# Patient Record
Sex: Male | Born: 1965 | Race: Black or African American | Hispanic: No | Marital: Single | Smoking: Current every day smoker
Health system: Southern US, Community
[De-identification: ages and names within clinical notes are randomized; demographics above are authoritative.]

## PROBLEM LIST (undated history)

## (undated) DIAGNOSIS — F32A Depression, unspecified: Secondary | ICD-10-CM

## (undated) DIAGNOSIS — K219 Gastro-esophageal reflux disease without esophagitis: Secondary | ICD-10-CM

## (undated) DIAGNOSIS — F1910 Other psychoactive substance abuse, uncomplicated: Secondary | ICD-10-CM

## (undated) DIAGNOSIS — F329 Major depressive disorder, single episode, unspecified: Secondary | ICD-10-CM

## (undated) DIAGNOSIS — F419 Anxiety disorder, unspecified: Secondary | ICD-10-CM

---

## 1998-02-20 ENCOUNTER — Emergency Department (HOSPITAL_COMMUNITY): Admission: EM | Admit: 1998-02-20 | Discharge: 1998-02-20 | Payer: Self-pay | Admitting: Emergency Medicine

## 1998-04-17 ENCOUNTER — Emergency Department (HOSPITAL_COMMUNITY): Admission: EM | Admit: 1998-04-17 | Discharge: 1998-04-17 | Payer: Self-pay | Admitting: Emergency Medicine

## 1998-04-17 ENCOUNTER — Encounter: Payer: Self-pay | Admitting: Emergency Medicine

## 1998-11-15 ENCOUNTER — Emergency Department (HOSPITAL_COMMUNITY): Admission: EM | Admit: 1998-11-15 | Discharge: 1998-11-15 | Payer: Self-pay | Admitting: Emergency Medicine

## 1999-09-29 ENCOUNTER — Emergency Department (HOSPITAL_COMMUNITY): Admission: EM | Admit: 1999-09-29 | Discharge: 1999-09-29 | Payer: Self-pay | Admitting: Emergency Medicine

## 1999-10-29 ENCOUNTER — Encounter: Payer: Self-pay | Admitting: Emergency Medicine

## 1999-10-29 ENCOUNTER — Emergency Department (HOSPITAL_COMMUNITY): Admission: EM | Admit: 1999-10-29 | Discharge: 1999-10-29 | Payer: Self-pay | Admitting: Emergency Medicine

## 2000-03-21 ENCOUNTER — Encounter: Payer: Self-pay | Admitting: Emergency Medicine

## 2000-03-21 ENCOUNTER — Emergency Department (HOSPITAL_COMMUNITY): Admission: EM | Admit: 2000-03-21 | Discharge: 2000-03-21 | Payer: Self-pay | Admitting: Emergency Medicine

## 2000-03-22 ENCOUNTER — Emergency Department (HOSPITAL_COMMUNITY): Admission: EM | Admit: 2000-03-22 | Discharge: 2000-03-22 | Payer: Self-pay | Admitting: Emergency Medicine

## 2000-12-19 ENCOUNTER — Emergency Department (HOSPITAL_COMMUNITY): Admission: EM | Admit: 2000-12-19 | Discharge: 2000-12-20 | Payer: Self-pay | Admitting: Emergency Medicine

## 2004-05-31 ENCOUNTER — Emergency Department (HOSPITAL_COMMUNITY): Admission: EM | Admit: 2004-05-31 | Discharge: 2004-05-31 | Payer: Self-pay | Admitting: Emergency Medicine

## 2007-10-11 ENCOUNTER — Emergency Department (HOSPITAL_COMMUNITY): Admission: EM | Admit: 2007-10-11 | Discharge: 2007-10-11 | Payer: Self-pay | Admitting: Emergency Medicine

## 2009-04-13 ENCOUNTER — Inpatient Hospital Stay (HOSPITAL_COMMUNITY): Admission: EM | Admit: 2009-04-13 | Discharge: 2009-04-21 | Payer: Self-pay | Admitting: Emergency Medicine

## 2009-04-20 ENCOUNTER — Ambulatory Visit: Payer: Self-pay | Admitting: Physical Medicine & Rehabilitation

## 2010-02-16 ENCOUNTER — Emergency Department (HOSPITAL_BASED_OUTPATIENT_CLINIC_OR_DEPARTMENT_OTHER): Admission: EM | Admit: 2010-02-16 | Discharge: 2010-02-16 | Payer: Self-pay | Admitting: Emergency Medicine

## 2010-07-20 LAB — COMPREHENSIVE METABOLIC PANEL
ALT: 7 U/L (ref 0–53)
AST: 18 U/L (ref 0–37)
CO2: 26 mEq/L (ref 19–32)
Calcium: 10.2 mg/dL (ref 8.4–10.5)
Creatinine, Ser: 1 mg/dL (ref 0.4–1.5)
GFR calc non Af Amer: >60 mL/min (ref >60)
Sodium: 142 mEq/L (ref 135–145)
Total Protein: 8.3 g/dL (ref 6.0–8.3)

## 2010-07-20 LAB — CBC
MCH: 32.3 pg (ref 26.0–34.0)
MCHC: 34 g/dL (ref 30.0–36.0)
MCV: 95.1 fL (ref 78.0–100.0)
Platelets: 288 10*3/uL (ref 150–400)
RBC: 5.55 MIL/uL (ref 4.22–5.81)

## 2010-07-20 LAB — DIFFERENTIAL
Basophils Absolute: 0.2 10*3/uL — ABNORMAL HIGH (ref 0.0–0.1)
Lymphocytes Relative: 23 % (ref 12–46)
Lymphs Abs: 2.2 10*3/uL (ref 0.7–4.0)
Monocytes Absolute: 0.4 10*3/uL (ref 0.1–1.0)
Neutro Abs: 7 10*3/uL (ref 1.7–7.7)

## 2010-07-20 LAB — LIPASE, BLOOD: Lipase: 26 U/L (ref 23–300)

## 2010-08-08 LAB — BASIC METABOLIC PANEL
BUN: 10 mg/dL (ref 6–23)
BUN: 5 mg/dL — ABNORMAL LOW (ref 6–23)
BUN: 7 mg/dL (ref 6–23)
BUN: 8 mg/dL (ref 6–23)
BUN: 9 mg/dL (ref 6–23)
Calcium: 8.1 mg/dL — ABNORMAL LOW (ref 8.4–10.5)
Calcium: 8.5 mg/dL (ref 8.4–10.5)
Calcium: 9.3 mg/dL (ref 8.4–10.5)
Chloride: 110 mEq/L (ref 96–112)
Creatinine, Ser: 0.75 mg/dL (ref 0.4–1.5)
Creatinine, Ser: 1.04 mg/dL (ref 0.4–1.5)
Creatinine, Ser: 1.36 mg/dL (ref 0.4–1.5)
GFR calc Af Amer: >60 mL/min (ref >60)
GFR calc Af Amer: >60 mL/min (ref >60)
GFR calc non Af Amer: 57 mL/min — ABNORMAL LOW (ref >60)
GFR calc non Af Amer: >60 mL/min (ref >60)
GFR calc non Af Amer: >60 mL/min (ref >60)
GFR calc non Af Amer: >60 mL/min (ref >60)
Glucose, Bld: 134 mg/dL — ABNORMAL HIGH (ref 70–99)
Glucose, Bld: 93 mg/dL (ref 70–99)
Potassium: 3.6 mEq/L (ref 3.5–5.1)
Potassium: 3.8 mEq/L (ref 3.5–5.1)

## 2010-08-08 LAB — TYPE AND SCREEN: Antibody Screen: NEGATIVE

## 2010-08-08 LAB — COMPREHENSIVE METABOLIC PANEL
ALT: 8 U/L (ref 0–53)
AST: 17 U/L (ref 0–37)
Albumin: 2.8 g/dL — ABNORMAL LOW (ref 3.5–5.2)
Calcium: 6.7 mg/dL — ABNORMAL LOW (ref 8.4–10.5)
Glucose, Bld: 131 mg/dL — ABNORMAL HIGH (ref 70–99)
Sodium: 141 mEq/L (ref 135–145)
Total Protein: 5.3 g/dL — ABNORMAL LOW (ref 6.0–8.3)

## 2010-08-08 LAB — CBC
HCT: 32.7 % — ABNORMAL LOW (ref 39.0–52.0)
HCT: 37.2 % — ABNORMAL LOW (ref 39.0–52.0)
MCHC: 33.8 g/dL (ref 30.0–36.0)
MCHC: 34.6 g/dL (ref 30.0–36.0)
MCV: 92.4 fL (ref 78.0–100.0)
MCV: 92.7 fL (ref 78.0–100.0)
MCV: 94 fL (ref 78.0–100.0)
Platelets: 203 10*3/uL (ref 150–400)
Platelets: 204 10*3/uL (ref 150–400)
Platelets: 229 10*3/uL (ref 150–400)
Platelets: 330 10*3/uL (ref 150–400)
Platelets: 361 10*3/uL (ref 150–400)
RBC: 3.51 MIL/uL — ABNORMAL LOW (ref 4.22–5.81)
RDW: 13.6 % (ref 11.5–15.5)
RDW: 13.8 % (ref 11.5–15.5)
WBC: 10.8 10*3/uL — ABNORMAL HIGH (ref 4.0–10.5)
WBC: 11 10*3/uL — ABNORMAL HIGH (ref 4.0–10.5)
WBC: 7.4 10*3/uL (ref 4.0–10.5)

## 2010-08-08 LAB — PROTIME-INR
INR: 1.14 (ref 0.00–1.49)
Prothrombin Time: 14.5 seconds (ref 11.6–15.2)

## 2010-08-08 LAB — URINALYSIS, ROUTINE W REFLEX MICROSCOPIC
Bilirubin Urine: NEGATIVE
Bilirubin Urine: NEGATIVE
Glucose, UA: NEGATIVE mg/dL
Glucose, UA: NEGATIVE mg/dL
Ketones, ur: 15 mg/dL — AB
Nitrite: NEGATIVE
Protein, ur: NEGATIVE mg/dL
Specific Gravity, Urine: 1.014 (ref 1.005–1.030)
pH: 5.5 (ref 5.0–8.0)
pH: 7.5 (ref 5.0–8.0)

## 2010-08-08 LAB — POCT I-STAT, CHEM 8
BUN: 6 mg/dL (ref 6–23)
Creatinine, Ser: 0.6 mg/dL (ref 0.4–1.5)
Hemoglobin: 14.3 g/dL (ref 13.0–17.0)
TCO2: 16 mmol/L (ref 0–100)

## 2010-08-08 LAB — TROPONIN I: Troponin I: 0.02 ng/mL (ref 0.00–0.06)

## 2010-08-08 LAB — GLUCOSE, CAPILLARY: Glucose-Capillary: 126 mg/dL — ABNORMAL HIGH (ref 70–99)

## 2010-08-08 LAB — POCT CARDIAC MARKERS
Myoglobin, poc: 149 ng/mL (ref 12–200)
Troponin i, poc: <0.05 ng/mL (ref 0.00–0.09)

## 2010-08-08 LAB — CK TOTAL AND CKMB (NOT AT ARMC)
Relative Index: 0.2 (ref 0.0–2.5)
Total CK: 1058 U/L — ABNORMAL HIGH (ref 7–232)

## 2010-08-08 LAB — CULTURE, BLOOD (SINGLE): Culture: NO GROWTH

## 2010-09-05 ENCOUNTER — Emergency Department (HOSPITAL_COMMUNITY): Payer: Self-pay

## 2010-09-05 ENCOUNTER — Emergency Department (HOSPITAL_COMMUNITY)
Admission: EM | Admit: 2010-09-05 | Discharge: 2010-09-05 | Discharge status: Against Medical Advice | Payer: Self-pay | Attending: Emergency Medicine | Admitting: Emergency Medicine

## 2010-09-05 DIAGNOSIS — W268XXA Contact with other sharp object(s), not elsewhere classified, initial encounter: Secondary | ICD-10-CM | POA: Insufficient documentation

## 2010-09-05 DIAGNOSIS — S61409A Unspecified open wound of unspecified hand, initial encounter: Secondary | ICD-10-CM | POA: Insufficient documentation

## 2012-03-29 ENCOUNTER — Emergency Department (HOSPITAL_COMMUNITY)
Admission: EM | Admit: 2012-03-29 | Discharge: 2012-03-29 | Disposition: A | Discharge status: Home / Self Care | Payer: Self-pay | Attending: Emergency Medicine | Admitting: Emergency Medicine

## 2012-03-29 ENCOUNTER — Emergency Department (HOSPITAL_COMMUNITY): Payer: Self-pay

## 2012-03-29 DIAGNOSIS — R1110 Vomiting, unspecified: Secondary | ICD-10-CM | POA: Insufficient documentation

## 2012-03-29 DIAGNOSIS — R109 Unspecified abdominal pain: Secondary | ICD-10-CM | POA: Insufficient documentation

## 2012-03-29 LAB — CBC WITH DIFFERENTIAL/PLATELET
Eosinophils Absolute: 0.1 10*3/uL (ref 0.0–0.7)
HCT: 47 % (ref 39.0–52.0)
Hemoglobin: 16.4 g/dL (ref 13.0–17.0)
Lymphs Abs: 1.9 10*3/uL (ref 0.7–4.0)
MCH: 31.9 pg (ref 26.0–34.0)
Monocytes Absolute: 0.6 10*3/uL (ref 0.1–1.0)
Monocytes Relative: 5 % (ref 3–12)
Neutrophils Relative %: 76 % (ref 43–77)
RBC: 5.14 MIL/uL (ref 4.22–5.81)

## 2012-03-29 LAB — COMPREHENSIVE METABOLIC PANEL
Alkaline Phosphatase: 80 U/L (ref 39–117)
BUN: 17 mg/dL (ref 6–23)
Chloride: 93 mEq/L — ABNORMAL LOW (ref 96–112)
GFR calc Af Amer: >90 mL/min (ref >90)
Glucose, Bld: 79 mg/dL (ref 70–99)
Potassium: 3.5 mEq/L (ref 3.5–5.1)
Total Bilirubin: 0.8 mg/dL (ref 0.3–1.2)
Total Protein: 8 g/dL (ref 6.0–8.3)

## 2012-03-29 LAB — LIPASE, BLOOD: Lipase: 14 U/L (ref 11–59)

## 2012-03-29 MED ORDER — IOHEXOL 300 MG/ML  SOLN
100.0000 mL | Freq: Once | INTRAMUSCULAR | Status: AC | PRN
  Administered 2012-03-29: 100 mL via INTRAVENOUS

## 2012-03-29 MED ORDER — PROMETHAZINE HCL 25 MG PO TABS: 25.0000 mg | ORAL_TABLET | Freq: Four times a day (QID) | ORAL | Status: DC | PRN

## 2012-03-29 MED ORDER — OXYCODONE-ACETAMINOPHEN 5-325 MG PO TABS: 1.0000 | ORAL_TABLET | Freq: Four times a day (QID) | ORAL | Status: DC | PRN

## 2012-03-29 MED ORDER — ONDANSETRON HCL 4 MG/2ML IJ SOLN
4.0000 mg | Freq: Once | INTRAMUSCULAR | Status: AC
  Administered 2012-03-29: 4 mg via INTRAVENOUS
  Filled 2012-03-29: qty 2

## 2012-03-29 MED ORDER — HYDROMORPHONE HCL PF 1 MG/ML IJ SOLN
1.0000 mg | Freq: Once | INTRAMUSCULAR | Status: AC
  Administered 2012-03-29: 1 mg via INTRAVENOUS
  Filled 2012-03-29: qty 1

## 2012-03-29 MED ORDER — SODIUM CHLORIDE 0.9 % IV SOLN
1000.0000 mL | Freq: Once | INTRAVENOUS | Status: AC
  Administered 2012-03-29: 1000 mL via INTRAVENOUS

## 2012-03-29 NOTE — ED Notes (Signed)
Pt vomited x2 since arrival.  Nose currently bleeding.

## 2012-03-29 NOTE — ED Notes (Signed)
Per EMS, pt c/o abdominal pain with n/v for the past 2-4 days.  Pt vitals and CBG WNL.  Pt A&O.  EMS states mass palpated in RLQ.

## 2012-03-29 NOTE — ED Notes (Signed)
Patient transported to CT 

## 2012-03-29 NOTE — ED Provider Notes (Signed)
History     CSN: [id number]  Arrival date & time 03/29/12  2010   First MD Initiated Contact with Patient 03/29/12 2015      Chief Complaint  Patient presents with  . Abdominal Pain  . Emesis    (Consider location/radiation/quality/duration/timing/severity/associated sxs/prior treatment) HPI... right upper quadrant pain for 3-4 days.  No nausea, vomiting, diarrhea, fever.  No previous abdominal surgery. No history of gallbladder disease. He smokes cigarettes and drinks alcohol excessively.  Status post multiple metallic plates in his head secondary to fall several years ago  No past medical history on file.  No past surgical history on file.  No family history on file.  History  Substance Use Topics  . Smoking status: Not on file  . Smokeless tobacco: Not on file  . Alcohol Use: Not on file      Review of Systems  All other systems reviewed and are negative.    Allergies  Review of patient's allergies indicates no known allergies.  Home Medications   Current Outpatient Rx  Name  Route  Sig  Dispense  Refill  . RANITIDINE HCL 150 MG PO TABS   Oral   Take 150 mg by mouth 2 (two) times daily.         . OXYCODONE-ACETAMINOPHEN 5-325 MG PO TABS   Oral   Take 1-2 tablets by mouth every 6 (six) hours as needed for pain.   15 tablet   0   . PROMETHAZINE HCL 25 MG PO TABS   Oral   Take 1 tablet (25 mg total) by mouth every 6 (six) hours as needed for nausea.   10 tablet   0     BP 155/81  Pulse 75  Temp 98.2 F (36.8 C) (Oral)  Resp 18  SpO2 100%  Physical Exam  Nursing note and vitals reviewed. Constitutional: He is oriented to person, place, and time. He appears well-developed and well-nourished.  HENT:  Head: Normocephalic and atraumatic.  Eyes: Conjunctivae normal and EOM are normal. Pupils are equal, round, and reactive to light.  Neck: Normal range of motion. Neck supple.  Cardiovascular: Normal rate, regular rhythm and normal heart sounds.    Pulmonary/Chest: Effort normal and breath sounds normal.  Abdominal: Soft. Bowel sounds are normal.       Minimal right upper quadrant tenderness  Musculoskeletal: Normal range of motion.  Neurological: He is alert and oriented to person, place, and time.  Skin: Skin is warm and dry.  Psychiatric: He has a normal mood and affect.    ED Course  Procedures (including critical care time)  Labs Reviewed  CBC WITH DIFFERENTIAL - Abnormal; Notable for the following:    Neutro Abs 7.9 (*)     All other components within normal limits  COMPREHENSIVE METABOLIC PANEL - Abnormal; Notable for the following:    Chloride 93 (*)     All other components within normal limits  LIPASE, BLOOD  URINALYSIS, MICROSCOPIC ONLY   Ct Abdomen Pelvis W Contrast  03/29/2012  *RADIOLOGY REPORT*  Clinical Data: Right upper abdominal pain.  CT ABDOMEN AND PELVIS WITH CONTRAST  Technique:  Multidetector CT imaging of the abdomen and pelvis was performed following the standard protocol during bolus administration of intravenous contrast.  Contrast: OMNIPAQUE IOHEXOL 300 MG/ML  SOLN  Comparison: 04/13/2009  Findings: Visualized lung bases clear.  Unremarkable liver, nondilated gallbladder, spleen, adrenal glands, pancreas, kidneys. Mild aortoiliac arterial calcifications.  Stomach is incompletely distended.  Proximal bowel is  mildly distended, and there is poor distal progression of the oral contrast material.  No discrete transition point.  The appendix is not discretely identified.  The colon is nondilated.  No ascites.  No free air.  Urinary bladder physiologically distended.  No hydronephrosis.  Lumbar spine intact.  IMPRESSION:  1. No acute abdominal process.   Original Report Authenticated By: D. Andria Rhein, MD      1. Abdominal pain       MDM  No acute abdomen. CT scan negative. Vital signs normal. Discharge him on Percocet #15 and Phenergan 25 mg #10        Donnetta Hutching, MD 03/29/12 2329

## 2012-03-29 NOTE — ED Notes (Signed)
Patient is alert and oriented x3.  He was given DC instructions and follow up visit instructions.  Patient gave verbal understanding.  He was DC ambulatory under his own power to home.  V/S stable.  He was not showing any signs of distress on DC 

## 2012-03-29 NOTE — ED Notes (Signed)
Bed:WA08<BR> Expected date:<BR> Expected time:<BR> Means of arrival:<BR> Comments:<BR>

## 2012-03-31 ENCOUNTER — Encounter (HOSPITAL_COMMUNITY): Payer: Self-pay | Admitting: *Deleted

## 2012-03-31 ENCOUNTER — Emergency Department (HOSPITAL_COMMUNITY)
Admission: EM | Admit: 2012-03-31 | Discharge: 2012-03-31 | Disposition: A | Discharge status: Home / Self Care | Payer: Self-pay | Attending: Emergency Medicine | Admitting: Emergency Medicine

## 2012-03-31 DIAGNOSIS — Z79899 Other long term (current) drug therapy: Secondary | ICD-10-CM | POA: Insufficient documentation

## 2012-03-31 DIAGNOSIS — R1110 Vomiting, unspecified: Secondary | ICD-10-CM

## 2012-03-31 DIAGNOSIS — R197 Diarrhea, unspecified: Secondary | ICD-10-CM | POA: Insufficient documentation

## 2012-03-31 DIAGNOSIS — R112 Nausea with vomiting, unspecified: Secondary | ICD-10-CM | POA: Insufficient documentation

## 2012-03-31 LAB — URINALYSIS, MICROSCOPIC ONLY
Glucose, UA: NEGATIVE mg/dL
Hgb urine dipstick: NEGATIVE
Specific Gravity, Urine: 1.03 (ref 1.005–1.030)
Urobilinogen, UA: 1 mg/dL (ref 0.0–1.0)

## 2012-03-31 LAB — COMPREHENSIVE METABOLIC PANEL
AST: 20 U/L (ref 0–37)
Albumin: 4.4 g/dL (ref 3.5–5.2)
Calcium: 10.5 mg/dL (ref 8.4–10.5)
Creatinine, Ser: 0.93 mg/dL (ref 0.50–1.35)

## 2012-03-31 MED ORDER — SODIUM CHLORIDE 0.9 % IV SOLN: Freq: Once | INTRAVENOUS | Status: DC

## 2012-03-31 MED ORDER — SODIUM CHLORIDE 0.9 % IV BOLUS (SEPSIS)
2000.0000 mL | Freq: Once | INTRAVENOUS | Status: AC
  Administered 2012-03-31: 2000 mL via INTRAVENOUS

## 2012-03-31 MED ORDER — ONDANSETRON HCL 4 MG/2ML IJ SOLN
4.0000 mg | Freq: Once | INTRAMUSCULAR | Status: AC
  Administered 2012-03-31: 4 mg via INTRAVENOUS
  Filled 2012-03-31: qty 2

## 2012-03-31 MED ORDER — KETOROLAC TROMETHAMINE 30 MG/ML IJ SOLN
30.0000 mg | Freq: Once | INTRAMUSCULAR | Status: AC
  Administered 2012-03-31: 30 mg via INTRAVENOUS
  Filled 2012-03-31: qty 1

## 2012-03-31 MED ORDER — KETOROLAC TROMETHAMINE 30 MG/ML IJ SOLN: 30.0000 mg | Freq: Once | INTRAMUSCULAR | Status: AC

## 2012-03-31 NOTE — ED Notes (Signed)
Rx for pt is ready to get picked up by pt.

## 2012-03-31 NOTE — ED Notes (Signed)
Pt in c/o abd pain and decreased appetite over last 7 days, also c/o n/v

## 2012-03-31 NOTE — ED Provider Notes (Signed)
History     CSN: [id number]  Arrival date & time 03/31/12  1831   First MD Initiated Contact with Patient 03/31/12 2000      Chief Complaint  Patient presents with  . Abdominal Pain    (Consider location/radiation/quality/duration/timing/severity/associated sxs/prior treatment) The history is provided by the patient.  Parker Berg is a 46 y.o. male here with abdominal pain and vomiting. Diffuse abdominal pain for a week, + vomiting and unable to tolerate PO. He was seen here two days ago and had nl CT ab/pel and nl labs. He was given a prescription of phenergan and percocet but didn't have enough money to fill them. He denies fever and urinary symptoms and diarrhea. No abdominal surgeries in the past.    History reviewed. No pertinent past medical history.  History reviewed. No pertinent past surgical history.  History reviewed. No pertinent family history.  History  Substance Use Topics  . Smoking status: Not on file  . Smokeless tobacco: Not on file  . Alcohol Use: Not on file      Review of Systems  Gastrointestinal: Positive for nausea, vomiting and abdominal pain.  All other systems reviewed and are negative.    Allergies  Review of patient's allergies indicates no known allergies.  Home Medications   Current Outpatient Rx  Name  Route  Sig  Dispense  Refill  . OXYCODONE-ACETAMINOPHEN 5-325 MG PO TABS   Oral   Take 1-2 tablets by mouth every 6 (six) hours as needed for pain.   15 tablet   0   . PROMETHAZINE HCL 25 MG PO TABS   Oral   Take 25 mg by mouth every 6 (six) hours as needed. Nausea         . RANITIDINE HCL 150 MG PO TABS   Oral   Take 150 mg by mouth 2 (two) times daily.           BP 122/80  Pulse 63  Temp 98.6 F (37 C) (Oral)  Resp 20  SpO2 100%  Physical Exam  Nursing note and vitals reviewed. Constitutional: He is oriented to person, place, and time. He appears well-developed and well-nourished.       Tearful, crying with  tears   HENT:  Head: Normocephalic.       MM slightly dry   Eyes: Conjunctivae normal are normal. Pupils are equal, round, and reactive to light.  Neck: Normal range of motion. Neck supple.  Cardiovascular: Normal rate, regular rhythm and normal heart sounds.   Pulmonary/Chest: Effort normal and breath sounds normal. No respiratory distress. He has no wheezes. He has no rales.  Abdominal: Soft. Bowel sounds are normal. He exhibits no distension. There is no tenderness. There is no rebound.  Musculoskeletal: Normal range of motion.  Neurological: He is alert and oriented to person, place, and time.  Skin: Skin is warm and dry.  Psychiatric:       Crying, anxious     ED Course  Procedures (including critical care time)  Labs Reviewed  COMPREHENSIVE METABOLIC PANEL - Abnormal; Notable for the following:    Sodium 134 (*)     Chloride 93 (*)     Glucose, Bld 111 (*)     All other components within normal limits  URINALYSIS, MICROSCOPIC ONLY - Abnormal; Notable for the following:    Color, Urine AMBER (*)  BIOCHEMICALS MAY BE AFFECTED BY COLOR   APPearance TURBID (*)     Bilirubin Urine SMALL (*)  Ketones, ur 40 (*)     Protein, ur 30 (*)     Leukocytes, UA SMALL (*)     All other components within normal limits  LIPASE, BLOOD   No results found.   No diagnosis found.    MDM  Parker Berg is a 46 y.o. male here with abdominal pain, vomiting. Likely gastroenteritis. Will hydrate patient, repeat labs. Will also give him several days of prescription.   10:39 PM Feels better. Repeat labs nl. UA showed small leuks but he is asymptomatic. Pharmacy able to fill his phenergan. Return precautions given.        Richardean Canal, MD 03/31/12 2240

## 2012-09-19 ENCOUNTER — Observation Stay (HOSPITAL_COMMUNITY)
Admission: EM | Admit: 2012-09-19 | Discharge: 2012-09-22 | DRG: 419 | Disposition: A | Discharge status: Home / Self Care | Attending: General Surgery | Admitting: General Surgery

## 2012-09-19 ENCOUNTER — Emergency Department (HOSPITAL_COMMUNITY)

## 2012-09-19 ENCOUNTER — Encounter (HOSPITAL_COMMUNITY): Payer: Self-pay

## 2012-09-19 DIAGNOSIS — J449 Chronic obstructive pulmonary disease, unspecified: Secondary | ICD-10-CM | POA: Insufficient documentation

## 2012-09-19 DIAGNOSIS — R1011 Right upper quadrant pain: Secondary | ICD-10-CM | POA: Insufficient documentation

## 2012-09-19 DIAGNOSIS — K819 Cholecystitis, unspecified: Principal | ICD-10-CM | POA: Insufficient documentation

## 2012-09-19 DIAGNOSIS — E860 Dehydration: Secondary | ICD-10-CM | POA: Insufficient documentation

## 2012-09-19 DIAGNOSIS — R109 Unspecified abdominal pain: Secondary | ICD-10-CM

## 2012-09-19 DIAGNOSIS — R1110 Vomiting, unspecified: Secondary | ICD-10-CM

## 2012-09-19 DIAGNOSIS — J4489 Other specified chronic obstructive pulmonary disease: Secondary | ICD-10-CM | POA: Insufficient documentation

## 2012-09-19 DIAGNOSIS — R110 Nausea: Secondary | ICD-10-CM

## 2012-09-19 DIAGNOSIS — K838 Other specified diseases of biliary tract: Secondary | ICD-10-CM | POA: Insufficient documentation

## 2012-09-19 DIAGNOSIS — K811 Chronic cholecystitis: Secondary | ICD-10-CM

## 2012-09-19 HISTORY — DX: Gastro-esophageal reflux disease without esophagitis: K21.9

## 2012-09-19 LAB — POCT I-STAT, CHEM 8
Glucose, Bld: 126 mg/dL — ABNORMAL HIGH (ref 70–99)
HCT: 60 % — ABNORMAL HIGH (ref 39.0–52.0)
Hemoglobin: 20.4 g/dL — ABNORMAL HIGH (ref 13.0–17.0)
Potassium: 2.8 mEq/L — ABNORMAL LOW (ref 3.5–5.1)

## 2012-09-19 LAB — COMPREHENSIVE METABOLIC PANEL
ALT: 19 U/L (ref 0–53)
Albumin: 4.4 g/dL (ref 3.5–5.2)
Alkaline Phosphatase: 84 U/L (ref 39–117)
BUN: 44 mg/dL — ABNORMAL HIGH (ref 6–23)
CO2: 34 mEq/L — ABNORMAL HIGH (ref 19–32)
Chloride: 82 mEq/L — ABNORMAL LOW (ref 96–112)
Total Protein: 8.4 g/dL — ABNORMAL HIGH (ref 6.0–8.3)

## 2012-09-19 LAB — CBC
HCT: 53.3 % — ABNORMAL HIGH (ref 39.0–52.0)
Hemoglobin: 18.9 g/dL — ABNORMAL HIGH (ref 13.0–17.0)
MCH: 31.3 pg (ref 26.0–34.0)
MCHC: 35.5 g/dL (ref 30.0–36.0)
MCV: 88.4 fL (ref 78.0–100.0)
Platelets: 349 10*3/uL (ref 150–400)
RBC: 6.03 MIL/uL — ABNORMAL HIGH (ref 4.22–5.81)
RDW: 11.8 % (ref 11.5–15.5)
WBC: 15.9 10*3/uL — ABNORMAL HIGH (ref 4.0–10.5)

## 2012-09-19 LAB — GLUCOSE, CAPILLARY: Glucose-Capillary: 110 mg/dL — ABNORMAL HIGH (ref 70–99)

## 2012-09-19 MED ORDER — MORPHINE SULFATE 4 MG/ML IJ SOLN
4.0000 mg | Freq: Once | INTRAMUSCULAR | Status: AC
  Administered 2012-09-19: 4 mg via INTRAVENOUS
  Filled 2012-09-19: qty 1

## 2012-09-19 MED ORDER — ONDANSETRON HCL 4 MG/2ML IJ SOLN
4.0000 mg | Freq: Once | INTRAMUSCULAR | Status: AC
  Administered 2012-09-19: 4 mg via INTRAVENOUS
  Filled 2012-09-19: qty 2

## 2012-09-19 MED ORDER — POTASSIUM CHLORIDE 10 MEQ/100ML IV SOLN
10.0000 meq | Freq: Once | INTRAVENOUS | Status: AC
  Administered 2012-09-19: 10 meq via INTRAVENOUS
  Filled 2012-09-19: qty 100

## 2012-09-19 MED ORDER — SODIUM CHLORIDE 0.9 % IV BOLUS (SEPSIS)
1000.0000 mL | Freq: Once | INTRAVENOUS | Status: AC
  Administered 2012-09-19: 1000 mL via INTRAVENOUS

## 2012-09-19 NOTE — ED Notes (Addendum)
Pt states he hasn't had an appetite since Wednesday. He states vomited dark brown emesis on Wednesday a couple times and hasn't felt good since then. Stays dizzy and it gets worse when he changes position. Can't keep anything down. They started him on phenergan, zantac, and prilosec. Tastes metal in his mouth. Denies coughing up blood or night sweats but does report a weight loss from 149 lbs from on 09/11/12 to 131.5 lbs on 09/19/12.

## 2012-09-19 NOTE — ED Notes (Signed)
Pt returned from ultrasound. Officer at bedside

## 2012-09-19 NOTE — ED Notes (Signed)
Spoke with Dr Donette Larry about plan of care for pt. Dr states waiting for consult from surgery to come down and see patient. Patient notified of delay.

## 2012-09-19 NOTE — ED Notes (Signed)
Pt c/o burning pain at IV site. No redness and swelling noted at IV site or skin. Slowed down potassium infusion to 50 ml/hr. Pt has normal saline hanging with potassium to help decrease burning sensation at IV site

## 2012-09-19 NOTE — ED Notes (Signed)
Warm blanket given. Officer remains at bedside

## 2012-09-19 NOTE — ED Notes (Signed)
Officer at bedside.

## 2012-09-19 NOTE — ED Notes (Signed)
Reassessed pt IV site. No redness or swelling noted. Pt denies burning at site and states feels much better after infusion was turned down. Officer at bedside. NAD noted at this time.

## 2012-09-19 NOTE — H&P (Signed)
Parker Berg is an 47 y.o. male.   Chief Complaint: abdominal pain HPI: the patient is a 47 year old male who is currently incarcerated.  For the last week and a half he has been experiencing right upper quadrant pain with nausea and vomiting. He states he has not been able to keep any kind of food or drink down. He denies any fevers or chills. He was brought to the emergency department where an ultrasound showed gallbladder sludge and thickening of the gallbladder wall.  Past Medical History  Diagnosis Date  . GERD (gastroesophageal reflux disease)     History reviewed. No pertinent past surgical history.  History reviewed. No pertinent family history. Social History:  reports that he has been smoking Cigarettes.  He has been smoking about 0.00 packs per day. He does not have any smokeless tobacco history on file. He reports that  drinks alcohol. He reports that he does not use illicit drugs.  Allergies: No Known Allergies   (Not in a hospital admission)  Results for orders placed during the hospital encounter of 09/19/12 (from the past 48 hour(s))  CBC     Status: Abnormal   Collection Time    09/19/12  5:52 PM      Result Value Range   WBC 15.9 (*) 4.0 - 10.5 K/uL   RBC 6.03 (*) 4.22 - 5.81 MIL/uL   Hemoglobin 18.9 (*) 13.0 - 17.0 g/dL   HCT 16.1 (*) 09.6 - 04.5 %   MCV 88.4  78.0 - 100.0 fL   MCH 31.3  26.0 - 34.0 pg   MCHC 35.5  30.0 - 36.0 g/dL   RDW 40.9  81.1 - 91.4 %   Platelets 349  150 - 400 K/uL  GLUCOSE, CAPILLARY     Status: Abnormal   Collection Time    09/19/12  6:05 PM      Result Value Range   Glucose-Capillary 110 (*) 70 - 99 mg/dL   Comment 1 Documented in Chart    POCT I-STAT, CHEM 8     Status: Abnormal   Collection Time    09/19/12  6:24 PM      Result Value Range   Sodium 130 (*) 135 - 145 mEq/L   Potassium 2.8 (*) 3.5 - 5.1 mEq/L   Chloride 81 (*) 96 - 112 mEq/L   BUN 48 (*) 6 - 23 mg/dL   Creatinine, Ser 7.82 (*) 0.50 - 1.35 mg/dL   Glucose,  Bld 956 (*) 70 - 99 mg/dL   Calcium, Ion 2.13 (*) 1.12 - 1.23 mmol/L   TCO2 42  0 - 100 mmol/L   Hemoglobin 20.4 (*) 13.0 - 17.0 g/dL   HCT 08.6 (*) 57.8 - 46.9 %  COMPREHENSIVE METABOLIC PANEL     Status: Abnormal   Collection Time    09/19/12  6:53 PM      Result Value Range   Sodium 130 (*) 135 - 145 mEq/L   Potassium 3.7  3.5 - 5.1 mEq/L   Comment: DELTA CHECK NOTED     SLIGHT HEMOLYSIS   Chloride 82 (*) 96 - 112 mEq/L   CO2 34 (*) 19 - 32 mEq/L   Glucose, Bld 106 (*) 70 - 99 mg/dL   BUN 44 (*) 6 - 23 mg/dL   Creatinine, Ser 6.29 (*) 0.50 - 1.35 mg/dL   Calcium 52.8  8.4 - 41.3 mg/dL   Total Protein 8.4 (*) 6.0 - 8.3 g/dL   Albumin 4.4  3.5 - 5.2  g/dL   AST 26  0 - 37 U/L   ALT 19  0 - 53 U/L   Alkaline Phosphatase 84  39 - 117 U/L   Total Bilirubin 1.2  0.3 - 1.2 mg/dL   GFR calc non Af Amer 51 (*) >90 mL/min   GFR calc Af Amer 59 (*) >90 mL/min   Comment:            The eGFR has been calculated     using the CKD EPI equation.     This calculation has not been     validated in all clinical     situations.     eGFR's persistently     <90 mL/min signify     possible Chronic Kidney Disease.   US Abdomen Complete  09/19/2012   *RADIOLOGY REPORT*  Clinical Data:  Right upper quadrant pain and emesis  ABDOMINAL ULTRASOUND COMPLETE  Comparison:  03/29/12.  Findings:  Gallbladder:  Sludge is noted within the lumen of the gallbladder. The gallbladder wall is upper limits of normal measuring 3 mm.  Common Bile Duct:  Within normal limits in caliber.  Liver: No focal mass lesion identified.  Within normal limits in parenchymal echogenicity.  IVC:  Appears normal.  Pancreas:  No abnormality identified.  Spleen:  Within normal limits in size and echotexture.  Right kidney:  Normal in size and parenchymal echogenicity.  No evidence of mass or hydronephrosis.  Left kidney:  Normal in size and parenchymal echogenicity.  No evidence of mass or hydronephrosis.  Abdominal Aorta:  No aneurysm  identified.  IMPRESSION:  1.  Gallbladder sludge and mild gallbladder wall thickening. (Sonographic Murphy's sign could not be adequately assessed as the patient received analgesics prior to exam).  Cannot rule out early acute cholecystitis.  If further imaging is clinically indicated hepatic biliary scan may be helpful to assess the patency of the cystic duct.   Original Report Authenticated By: Signa Kell, M.D.    Review of Systems  HENT: Positive for neck pain.   Eyes: Negative.   Respiratory: Negative.   Cardiovascular: Negative.   Gastrointestinal: Positive for nausea, vomiting and abdominal pain.  Genitourinary: Negative.   Skin: Negative.   Neurological: Positive for weakness.  Endo/Heme/Allergies: Negative.   Psychiatric/Behavioral: Negative.     Blood pressure 106/58, pulse 62, temperature 97.7 F (36.5 C), temperature source Axillary, resp. rate 22, SpO2 98.00%. Physical Exam  Constitutional: He is oriented to person, place, and time. He appears well-developed and well-nourished.  HENT:  Head: Normocephalic and atraumatic.  Eyes: Conjunctivae and EOM are normal. Pupils are equal, round, and reactive to light.  Neck: Normal range of motion. Neck supple.  Cardiovascular: Normal rate, regular rhythm and normal heart sounds.   Respiratory: Effort normal and breath sounds normal.  GI: Soft. Bowel sounds are normal.  Tender in ruq  Musculoskeletal: Normal range of motion.  Neurological: He is alert and oriented to person, place, and time.  Skin: Skin is warm and dry.  Psychiatric: He has a normal mood and affect. His behavior is normal.     Assessment/Plan The patient has what sounds like cholecystitis with gallbladder sludge. He also appears to be dehydrated. At this point I would recommend admission to the hospital for IV hydration. I think he would benefit from having his gallbladder removed during this admission  TOTH III,Analilia Geddis S 09/19/2012, 11:57 PM

## 2012-09-19 NOTE — ED Notes (Signed)
Slowed down potassium to 75 ml/hr due to pt c/o burning at site

## 2012-09-19 NOTE — ED Provider Notes (Signed)
History     CSN: [id number]  Arrival date & time 09/19/12  1717   First MD Initiated Contact with Patient 09/19/12 1835      Chief Complaint  Patient presents with  . Hematemesis  . Dizziness    (Consider location/radiation/quality/duration/timing/severity/associated sxs/prior treatment) HPI Comments: 47 y.o. male who has hx of alcoholism (hasnt' had a beverage for the past 20 days), has been in prison in the interim -- presents to the Er w/ the cc of having difficulty tolerating PO.  No diarrhea with this. nonbilious vomitus. He has episode of blood in vomit a few days ago -- but he has not had any blood in his vomit the past few days. He has abdominal pain associated w/ this.  He has had chills associated w/ this. His abdominal pain is right upper quadrant.   Patient is a 47 y.o. male presenting with general illness. The history is provided by the patient.  Illness  Episode onset: last week. The onset was gradual. The problem occurs rarely. The problem has been unchanged. The problem is moderate. Associated symptoms include abdominal pain, nausea and vomiting. Pertinent negatives include no constipation, no diarrhea, no headaches, no mouth sores, no neck pain, no wheezing, no rash, no eye discharge and no eye pain.    Past Medical History  Diagnosis Date  . GERD (gastroesophageal reflux disease)     History reviewed. No pertinent past surgical history.  History reviewed. No pertinent family history.  History  Substance Use Topics  . Smoking status: Current Every Day Smoker    Types: Cigarettes  . Smokeless tobacco: Not on file  . Alcohol Use: Yes     Comment: normally      Review of Systems  Constitutional: Negative for chills and activity change.  HENT: Negative for drooling, mouth sores and neck pain.   Eyes: Negative for pain and discharge.  Respiratory: Negative for chest tightness, shortness of breath and wheezing.   Gastrointestinal: Positive for nausea,  vomiting and abdominal pain. Negative for diarrhea and constipation.  Genitourinary: Negative for dysuria, flank pain and difficulty urinating.  Musculoskeletal: Negative for back pain and arthralgias.  Skin: Negative for color change, pallor and rash.  Neurological: Negative for dizziness, weakness, light-headedness and headaches.  Psychiatric/Behavioral: Negative for behavioral problems, confusion and agitation.    Allergies  Review of patient's allergies indicates no known allergies.  Home Medications   Current Outpatient Rx  Name  Route  Sig  Dispense  Refill  . oxyCODONE-acetaminophen (PERCOCET/ROXICET) 5-325 MG per tablet   Oral   Take 1-2 tablets by mouth every 6 (six) hours as needed for pain.   15 tablet   0   . promethazine (PHENERGAN) 25 MG tablet   Oral   Take 25 mg by mouth every 6 (six) hours as needed. Nausea         . ranitidine (ZANTAC) 150 MG tablet   Oral   Take 150 mg by mouth 2 (two) times daily.           BP 110/82  Pulse 85  Temp(Src) 97.7 F (36.5 C) (Axillary)  Resp 20  SpO2 100%  Physical Exam  Constitutional: He is oriented to person, place, and time. He appears well-developed. No distress.  HENT:  Head: Normocephalic.  Eyes: Pupils are equal, round, and reactive to light. Right eye exhibits no discharge. Left eye exhibits no discharge.  Neck: Neck supple. No tracheal deviation present.  Cardiovascular: Regular rhythm and intact distal pulses.  Pulmonary/Chest: Effort normal. No stridor. No respiratory distress. He has no wheezes.  Abdominal: Soft. He exhibits no distension. There is tenderness. There is no rebound.  Right upper quadrant ttp, murphy sign is positive.   Musculoskeletal: Normal range of motion. He exhibits no tenderness.  Neurological: He is alert and oriented to person, place, and time. No cranial nerve deficit.  Skin: Skin is warm. No rash noted. He is not diaphoretic. No erythema.    ED Course  Procedures  (including critical care time)  Labs Reviewed  GLUCOSE, CAPILLARY - Abnormal; Notable for the following:    Glucose-Capillary 110 (*)    All other components within normal limits  POCT I-STAT, CHEM 8 - Abnormal; Notable for the following:    Sodium 130 (*)    Potassium 2.8 (*)    Chloride 81 (*)    BUN 48 (*)    Creatinine, Ser 1.80 (*)    Glucose, Bld 126 (*)    Calcium, Ion 1.05 (*)    Hemoglobin 20.4 (*)    HCT 60.0 (*)    All other components within normal limits  CBC   US Abdomen Complete  09/19/2012   *RADIOLOGY REPORT*  Clinical Data:  Right upper quadrant pain and emesis  ABDOMINAL ULTRASOUND COMPLETE  Comparison:  03/29/12.  Findings:  Gallbladder:  Sludge is noted within the lumen of the gallbladder. The gallbladder wall is upper limits of normal measuring 3 mm.  Common Bile Duct:  Within normal limits in caliber.  Liver: No focal mass lesion identified.  Within normal limits in parenchymal echogenicity.  IVC:  Appears normal.  Pancreas:  No abnormality identified.  Spleen:  Within normal limits in size and echotexture.  Right kidney:  Normal in size and parenchymal echogenicity.  No evidence of mass or hydronephrosis.  Left kidney:  Normal in size and parenchymal echogenicity.  No evidence of mass or hydronephrosis.  Abdominal Aorta:  No aneurysm identified.  IMPRESSION:  1.  Gallbladder sludge and mild gallbladder wall thickening. (Sonographic Murphy's sign could not be adequately assessed as the patient received analgesics prior to exam).  Cannot rule out early acute cholecystitis.  If further imaging is clinically indicated hepatic biliary scan may be helpful to assess the patency of the cystic duct.   Original Report Authenticated By: Signa Kell, M.D.       MDM  Pt with abdominal pain after eating, right upper quadrant. Suspect gallbladder etiology of pain, will get labs, do U/S.   U/S is not equivocal -- but w/ pt unable to tolerate PO, and with leukocytosis, with  metabolic changes that are consistent with intractable nausea and vomiting, and based on physical exam, will consult general surgery for further evaluation as with metabolic derangements and symptoms surgery might be indicated.   Pt admitted to surgery team.    1. Abdominal pain   2. Nausea   3. Vomiting   4. Dehydration            Bernadene Person, MD 09/21/12 0003

## 2012-09-20 DIAGNOSIS — E876 Hypokalemia: Secondary | ICD-10-CM

## 2012-09-20 LAB — CBC
HCT: 45.8 % (ref 39.0–52.0)
MCH: 30.9 pg (ref 26.0–34.0)
MCV: 88.4 fL (ref 78.0–100.0)
Platelets: 230 10*3/uL (ref 150–400)
RBC: 5.18 MIL/uL (ref 4.22–5.81)

## 2012-09-20 LAB — BASIC METABOLIC PANEL
CO2: 37 mEq/L — ABNORMAL HIGH (ref 19–32)
Calcium: 8.9 mg/dL (ref 8.4–10.5)
Chloride: 91 mEq/L — ABNORMAL LOW (ref 96–112)
Glucose, Bld: 117 mg/dL — ABNORMAL HIGH (ref 70–99)
Sodium: 134 mEq/L — ABNORMAL LOW (ref 135–145)

## 2012-09-20 MED ORDER — ONDANSETRON HCL 4 MG/2ML IJ SOLN
4.0000 mg | Freq: Four times a day (QID) | INTRAMUSCULAR | Status: DC | PRN
  Administered 2012-09-20 – 2012-09-21 (×3): 4 mg via INTRAVENOUS
  Filled 2012-09-20 (×3): qty 2

## 2012-09-20 MED ORDER — KCL IN DEXTROSE-NACL 20-5-0.9 MEQ/L-%-% IV SOLN
INTRAVENOUS | Status: DC
  Administered 2012-09-20: 02:00:00 via INTRAVENOUS
  Filled 2012-09-20 (×3): qty 1000

## 2012-09-20 MED ORDER — MORPHINE SULFATE 4 MG/ML IJ SOLN
4.0000 mg | INTRAMUSCULAR | Status: DC | PRN
  Administered 2012-09-20 – 2012-09-21 (×8): 4 mg via INTRAVENOUS
  Filled 2012-09-20 (×9): qty 1

## 2012-09-20 MED ORDER — BIOTENE DRY MOUTH MT LIQD
15.0000 mL | Freq: Two times a day (BID) | OROMUCOSAL | Status: DC
  Administered 2012-09-20 (×2): 15 mL via OROMUCOSAL

## 2012-09-20 MED ORDER — POTASSIUM CHLORIDE CRYS ER 20 MEQ PO TBCR
30.0000 meq | EXTENDED_RELEASE_TABLET | Freq: Three times a day (TID) | ORAL | Status: DC
  Administered 2012-09-20 – 2012-09-22 (×5): 30 meq via ORAL
  Filled 2012-09-20 (×4): qty 1
  Filled 2012-09-20 (×2): qty 3
  Filled 2012-09-20 (×2): qty 1

## 2012-09-20 MED ORDER — PANTOPRAZOLE SODIUM 40 MG IV SOLR
40.0000 mg | Freq: Every day | INTRAVENOUS | Status: DC
  Administered 2012-09-20 – 2012-09-21 (×3): 40 mg via INTRAVENOUS
  Filled 2012-09-20 (×5): qty 40

## 2012-09-20 MED ORDER — CIPROFLOXACIN IN D5W 400 MG/200ML IV SOLN
400.0000 mg | Freq: Two times a day (BID) | INTRAVENOUS | Status: DC
  Administered 2012-09-20 – 2012-09-21 (×3): 400 mg via INTRAVENOUS
  Filled 2012-09-20 (×7): qty 200

## 2012-09-20 MED ORDER — KCL IN DEXTROSE-NACL 40-5-0.9 MEQ/L-%-% IV SOLN: INTRAVENOUS | Status: DC

## 2012-09-20 MED ORDER — KCL IN DEXTROSE-NACL 40-5-0.9 MEQ/L-%-% IV SOLN
INTRAVENOUS | Status: AC
  Administered 2012-09-20: 12:00:00 via INTRAVENOUS
  Filled 2012-09-20 (×2): qty 1000

## 2012-09-20 MED ORDER — CHLORHEXIDINE GLUCONATE 0.12 % MT SOLN
15.0000 mL | Freq: Two times a day (BID) | OROMUCOSAL | Status: DC
  Administered 2012-09-20 (×2): 15 mL via OROMUCOSAL
  Filled 2012-09-20 (×2): qty 15

## 2012-09-20 MED ORDER — KCL IN DEXTROSE-NACL 40-5-0.9 MEQ/L-%-% IV SOLN
INTRAVENOUS | Status: DC
  Administered 2012-09-20 – 2012-09-21 (×5): via INTRAVENOUS
  Filled 2012-09-20 (×9): qty 1000

## 2012-09-20 MED ORDER — DIPHENHYDRAMINE HCL 25 MG PO CAPS
25.0000 mg | ORAL_CAPSULE | Freq: Four times a day (QID) | ORAL | Status: DC | PRN
  Administered 2012-09-20 – 2012-09-22 (×3): 25 mg via ORAL
  Filled 2012-09-20: qty 2
  Filled 2012-09-20: qty 1

## 2012-09-20 NOTE — ED Notes (Signed)
Report given to rn.  Pt transported to floor via stretcher.

## 2012-09-20 NOTE — Progress Notes (Signed)
  Subjective: No nausea or vomiting, wants his GB out so he can eat.    Objective: Vital signs in last 24 hours: Temp:  [97.7 F (36.5 C)-98.1 F (36.7 C)] 98.1 F (36.7 C) (05/17 0506) Pulse Rate:  [58-85] 60 (05/17 0506) Resp:  [15-23] 16 (05/17 0506) BP: (92-122)/(58-82) 98/65 mmHg (05/17 0506) SpO2:  [96 %-100 %] 98 % (05/17 0506) Weight:  [60.328 kg (133 lb)] 60.328 kg (133 lb) (05/17 0200) Last BM Date: 09/17/12  Afebrile, BP down some this AM, K+ 2.8, creatinine is down some, H/H stable, no repeat on LFT's no lipase WBC is better Intake/Output from previous day:   Intake/Output this shift:    General appearance: alert, cooperative and no distress Resp: clear to auscultation bilaterally GI: soft, minimally tender over RUQ, + BS.  Lab Results:   Recent Labs  09/19/12 1752 09/19/12 1824 09/20/12 0451  WBC 15.9*  --  9.1  HGB 18.9* 20.4* 16.0  HCT 53.3* 60.0* 45.8  PLT 349  --  230    BMET  Recent Labs  09/19/12 1853 09/20/12 0451  NA 130* 134*  K 3.7 2.8*  CL 82* 91*  CO2 34* 37*  GLUCOSE 106* 117*  BUN 44* 32*  CREATININE 1.60* 1.48*  CALCIUM 10.2 8.9   PT/INR No results found for this basename: LABPROT, INR,  in the last 72 hours   Recent Labs Lab 09/19/12 1853  AST 26  ALT 19  ALKPHOS 84  BILITOT 1.2  PROT 8.4*  ALBUMIN 4.4     Lipase     Component Value Date/Time   LIPASE 17 03/31/2012 1933     Studies/Results: US Abdomen Complete  09/19/2012   *RADIOLOGY REPORT*  Clinical Data:  Right upper quadrant pain and emesis  ABDOMINAL ULTRASOUND COMPLETE  Comparison:  03/29/12.  Findings:  Gallbladder:  Sludge is noted within the lumen of the gallbladder. The gallbladder wall is upper limits of normal measuring 3 mm.  Common Bile Duct:  Within normal limits in caliber.  Liver: No focal mass lesion identified.  Within normal limits in parenchymal echogenicity.  IVC:  Appears normal.  Pancreas:  No abnormality identified.  Spleen:  Within  normal limits in size and echotexture.  Right kidney:  Normal in size and parenchymal echogenicity.  No evidence of mass or hydronephrosis.  Left kidney:  Normal in size and parenchymal echogenicity.  No evidence of mass or hydronephrosis.  Abdominal Aorta:  No aneurysm identified.  IMPRESSION:  1.  Gallbladder sludge and mild gallbladder wall thickening. (Sonographic Murphy's sign could not be adequately assessed as the patient received analgesics prior to exam).  Cannot rule out early acute cholecystitis.  If further imaging is clinically indicated hepatic biliary scan may be helpful to assess the patency of the cystic duct.   Original Report Authenticated By: Signa Kell, M.D.    Medications: . antiseptic oral rinse  15 mL Mouth Rinse q12n4p  . chlorhexidine  15 mL Mouth Rinse BID  . pantoprazole (PROTONIX) IV  40 mg Intravenous QHS    Assessment/Plan Cholecystitis Hypokalemia Acute renal insuffiencey Dehydration Hx of tobacco and ETOH use Multitraum with VDRF 2010  Plan:  Hydrate more, replace K+, add IV cipro today, recheck labs in AM and plan surgery tomorrow.   LOS: 1 day    Parker Berg 09/20/2012

## 2012-09-21 ENCOUNTER — Encounter (HOSPITAL_COMMUNITY): Payer: Self-pay | Admitting: Anesthesiology

## 2012-09-21 ENCOUNTER — Inpatient Hospital Stay (HOSPITAL_COMMUNITY): Admitting: Anesthesiology

## 2012-09-21 ENCOUNTER — Encounter (HOSPITAL_COMMUNITY)
Admission: EM | Disposition: A | Discharge status: Home / Self Care | Payer: Self-pay | Source: Home / Self Care | Attending: Emergency Medicine

## 2012-09-21 ENCOUNTER — Encounter (HOSPITAL_COMMUNITY): Payer: Self-pay | Admitting: Certified Registered"

## 2012-09-21 ENCOUNTER — Inpatient Hospital Stay (HOSPITAL_COMMUNITY)

## 2012-09-21 HISTORY — PX: CHOLECYSTECTOMY: SHX55

## 2012-09-21 LAB — COMPREHENSIVE METABOLIC PANEL
ALT: 11 U/L (ref 0–53)
AST: 17 U/L (ref 0–37)
Calcium: 8.5 mg/dL (ref 8.4–10.5)
Sodium: 132 mEq/L — ABNORMAL LOW (ref 135–145)
Total Protein: 5.5 g/dL — ABNORMAL LOW (ref 6.0–8.3)

## 2012-09-21 LAB — CBC
MCH: 31.3 pg (ref 26.0–34.0)
MCHC: 35.6 g/dL (ref 30.0–36.0)
Platelets: 207 10*3/uL (ref 150–400)
RDW: 11.6 % (ref 11.5–15.5)

## 2012-09-21 LAB — SURGICAL PCR SCREEN: Staphylococcus aureus: NEGATIVE

## 2012-09-21 SURGERY — LAPAROSCOPIC CHOLECYSTECTOMY WITH INTRAOPERATIVE CHOLANGIOGRAM
Anesthesia: General | Site: Abdomen | Wound class: Clean

## 2012-09-21 MED ORDER — DIAZEPAM 5 MG/ML IJ SOLN
2.5000 mg | Freq: Once | INTRAMUSCULAR | Status: AC
  Administered 2012-09-21: 2.5 mg via INTRAVENOUS

## 2012-09-21 MED ORDER — OXYCODONE-ACETAMINOPHEN 5-325 MG PO TABS
1.0000 | ORAL_TABLET | ORAL | Status: DC | PRN
  Administered 2012-09-21 – 2012-09-22 (×5): 2 via ORAL
  Filled 2012-09-21 (×5): qty 2

## 2012-09-21 MED ORDER — FENTANYL CITRATE 0.05 MG/ML IJ SOLN
INTRAMUSCULAR | Status: DC | PRN
  Administered 2012-09-21: 100 ug via INTRAVENOUS
  Administered 2012-09-21: 50 ug via INTRAVENOUS

## 2012-09-21 MED ORDER — LIDOCAINE HCL (CARDIAC) 20 MG/ML IV SOLN
INTRAVENOUS | Status: DC | PRN
  Administered 2012-09-21: 100 mg via INTRAVENOUS

## 2012-09-21 MED ORDER — NEOSTIGMINE METHYLSULFATE 1 MG/ML IJ SOLN
INTRAMUSCULAR | Status: DC | PRN
  Administered 2012-09-21: 3 mg via INTRAVENOUS

## 2012-09-21 MED ORDER — BUPIVACAINE-EPINEPHRINE 0.25% -1:200000 IJ SOLN
INTRAMUSCULAR | Status: DC | PRN
  Administered 2012-09-21: 10 mL

## 2012-09-21 MED ORDER — OXYCODONE HCL 5 MG/5ML PO SOLN: 5.0000 mg | Freq: Once | ORAL | Status: AC | PRN

## 2012-09-21 MED ORDER — LACTATED RINGERS IV SOLN
INTRAVENOUS | Status: DC | PRN
  Administered 2012-09-21: 08:00:00 via INTRAVENOUS

## 2012-09-21 MED ORDER — ROCURONIUM BROMIDE 100 MG/10ML IV SOLN
INTRAVENOUS | Status: DC | PRN
  Administered 2012-09-21: 50 mg via INTRAVENOUS

## 2012-09-21 MED ORDER — SODIUM CHLORIDE 0.9 % IR SOLN
Status: DC | PRN
  Administered 2012-09-21: 1000 mL

## 2012-09-21 MED ORDER — GLYCOPYRROLATE 0.2 MG/ML IJ SOLN
INTRAMUSCULAR | Status: DC | PRN
  Administered 2012-09-21: 0.4 mg via INTRAVENOUS

## 2012-09-21 MED ORDER — EPHEDRINE SULFATE 50 MG/ML IJ SOLN
INTRAMUSCULAR | Status: DC | PRN
  Administered 2012-09-21: 10 mg via INTRAVENOUS

## 2012-09-21 MED ORDER — OXYCODONE HCL 5 MG PO TABS
5.0000 mg | ORAL_TABLET | Freq: Once | ORAL | Status: AC | PRN
  Filled 2012-09-21: qty 1

## 2012-09-21 MED ORDER — SODIUM CHLORIDE 0.9 % IV SOLN
INTRAVENOUS | Status: DC | PRN
  Administered 2012-09-21: 09:00:00

## 2012-09-21 MED ORDER — PROPOFOL 10 MG/ML IV BOLUS
INTRAVENOUS | Status: DC | PRN
  Administered 2012-09-21: 200 mg via INTRAVENOUS

## 2012-09-21 MED ORDER — ONDANSETRON HCL 4 MG/2ML IJ SOLN
INTRAMUSCULAR | Status: DC | PRN
  Administered 2012-09-21: 4 mg via INTRAVENOUS

## 2012-09-21 MED ORDER — MIDAZOLAM HCL 5 MG/5ML IJ SOLN
INTRAMUSCULAR | Status: DC | PRN
  Administered 2012-09-21: 2 mg via INTRAVENOUS

## 2012-09-21 MED ORDER — HYDROMORPHONE HCL PF 1 MG/ML IJ SOLN
0.2500 mg | INTRAMUSCULAR | Status: DC | PRN
  Administered 2012-09-21 (×2): 0.5 mg via INTRAVENOUS

## 2012-09-21 MED ORDER — CIPROFLOXACIN IN D5W 400 MG/200ML IV SOLN
400.0000 mg | INTRAVENOUS | Status: AC
  Administered 2012-09-21: 400 mg via INTRAVENOUS
  Filled 2012-09-21: qty 200

## 2012-09-21 SURGICAL SUPPLY — 38 items
ADH SKN CLS APL DERMABOND .7 (GAUZE/BANDAGES/DRESSINGS) ×1
APPLIER CLIP ROT 10 11.4 M/L (STAPLE) ×2
APR CLP MED LRG 11.4X10 (STAPLE) ×1
BAG SPEC RTRVL LRG 6X4 10 (ENDOMECHANICALS) ×1
BLADE SURG ROTATE 9660 (MISCELLANEOUS) IMPLANT
CANISTER SUCTION 2500CC (MISCELLANEOUS) ×2 IMPLANT
CATH REDDICK CHOLANGI 4FR 50CM (CATHETERS) ×2 IMPLANT
CHLORAPREP W/TINT 26ML (MISCELLANEOUS) ×2 IMPLANT
CLIP APPLIE ROT 10 11.4 M/L (STAPLE) ×1 IMPLANT
CLOTH BEACON ORANGE TIMEOUT ST (SAFETY) ×2 IMPLANT
COVER MAYO STAND STRL (DRAPES) ×2 IMPLANT
COVER SURGICAL LIGHT HANDLE (MISCELLANEOUS) ×2 IMPLANT
DECANTER SPIKE VIAL GLASS SM (MISCELLANEOUS) ×4 IMPLANT
DERMABOND ADVANCED (GAUZE/BANDAGES/DRESSINGS) ×1
DERMABOND ADVANCED .7 DNX12 (GAUZE/BANDAGES/DRESSINGS) ×1 IMPLANT
DRAPE C-ARM 42X72 X-RAY (DRAPES) ×2 IMPLANT
DRAPE UTILITY 15X26 W/TAPE STR (DRAPE) ×4 IMPLANT
ELECT REM PT RETURN 9FT ADLT (ELECTROSURGICAL) ×2
ELECTRODE REM PT RTRN 9FT ADLT (ELECTROSURGICAL) ×1 IMPLANT
GLOVE BIO SURGEON STRL SZ7.5 (GLOVE) ×2 IMPLANT
GOWN STRL NON-REIN LRG LVL3 (GOWN DISPOSABLE) ×8 IMPLANT
IV CATH 14GX2 1/4 (CATHETERS) ×2 IMPLANT
KIT BASIN OR (CUSTOM PROCEDURE TRAY) ×2 IMPLANT
KIT ROOM TURNOVER OR (KITS) ×2 IMPLANT
NS IRRIG 1000ML POUR BTL (IV SOLUTION) ×2 IMPLANT
PAD ARMBOARD 7.5X6 YLW CONV (MISCELLANEOUS) ×2 IMPLANT
POUCH SPECIMEN RETRIEVAL 10MM (ENDOMECHANICALS) ×2 IMPLANT
SCISSORS LAP 5X35 DISP (ENDOMECHANICALS) IMPLANT
SET IRRIG TUBING LAPAROSCOPIC (IRRIGATION / IRRIGATOR) ×2 IMPLANT
SLEEVE ENDOPATH XCEL 5M (ENDOMECHANICALS) ×2 IMPLANT
SPECIMEN JAR SMALL (MISCELLANEOUS) ×2 IMPLANT
SUT MNCRL AB 4-0 PS2 18 (SUTURE) ×2 IMPLANT
TOWEL OR 17X24 6PK STRL BLUE (TOWEL DISPOSABLE) ×2 IMPLANT
TOWEL OR 17X26 10 PK STRL BLUE (TOWEL DISPOSABLE) ×2 IMPLANT
TRAY LAPAROSCOPIC (CUSTOM PROCEDURE TRAY) ×2 IMPLANT
TROCAR XCEL BLUNT TIP 100MML (ENDOMECHANICALS) ×2 IMPLANT
TROCAR XCEL NON-BLD 11X100MML (ENDOMECHANICALS) ×2 IMPLANT
TROCAR XCEL NON-BLD 5MMX100MML (ENDOMECHANICALS) ×2 IMPLANT

## 2012-09-21 NOTE — Anesthesia Postprocedure Evaluation (Signed)
  Anesthesia Post-op Note  Patient: Parker Berg  Procedure(s) Performed: Procedure(s): LAPAROSCOPIC CHOLECYSTECTOMY WITH INTRAOPERATIVE CHOLANGIOGRAM (N/A)  Patient Location: PACU  Anesthesia Type:General  Level of Consciousness: awake and alert   Airway and Oxygen Therapy: Patient Spontanous Breathing  Post-op Pain: mild  Post-op Assessment: Post-op Vital signs reviewed, Patient's Cardiovascular Status Stable, Respiratory Function Stable, Patent Airway, No signs of Nausea or vomiting and Pain level controlled  Post-op Vital Signs: stable  Complications: No apparent anesthesia complications

## 2012-09-21 NOTE — Anesthesia Preprocedure Evaluation (Signed)
Anesthesia Evaluation  Patient identified by MRN, date of birth, ID band Patient awake    Reviewed: Allergy & Precautions, H&P , NPO status , Patient's Chart, lab work & pertinent test results  Airway Mallampati: I TM Distance: >3 FB Neck ROM: Full    Dental   Pulmonary COPDCurrent Smoker,  + rhonchi         Cardiovascular Rhythm:Regular Rate:Normal     Neuro/Psych    GI/Hepatic GERD-  ,  Endo/Other    Renal/GU      Musculoskeletal   Abdominal   Peds  Hematology   Anesthesia Other Findings   Reproductive/Obstetrics                           Anesthesia Physical Anesthesia Plan  ASA: II and emergent  Anesthesia Plan: General   Post-op Pain Management:    Induction: Intravenous  Airway Management Planned: Oral ETT  Additional Equipment:   Intra-op Plan:   Post-operative Plan: Extubation in OR  Informed Consent: I have reviewed the patients History and Physical, chart, labs and discussed the procedure including the risks, benefits and alternatives for the proposed anesthesia with the patient or authorized representative who has indicated his/her understanding and acceptance.     Plan Discussed with: CRNA and Surgeon  Anesthesia Plan Comments:         Anesthesia Quick Evaluation

## 2012-09-21 NOTE — Preoperative (Signed)
Beta Blockers   Reason not to administer Beta Blockers:Not Applicable 

## 2012-09-21 NOTE — Transfer of Care (Signed)
Immediate Anesthesia Transfer of Care Note  Patient: Parker Berg  Procedure(s) Performed: Procedure(s): LAPAROSCOPIC CHOLECYSTECTOMY WITH INTRAOPERATIVE CHOLANGIOGRAM (N/A)  Patient Location: PACU  Anesthesia Type:General  Level of Consciousness: awake, alert , oriented and patient cooperative  Airway & Oxygen Therapy: Patient Spontanous Breathing and Patient connected to nasal cannula oxygen  Post-op Assessment: Report given to PACU RN, Post -op Vital signs reviewed and stable and Patient moving all extremities  Post vital signs: Reviewed and stable  Complications: No apparent anesthesia complications

## 2012-09-21 NOTE — Interval H&P Note (Signed)
History and Physical Interval Note:  09/21/2012 7:49 AM  Parker Berg  has presented today for surgery, with the diagnosis of cholecystitis  The various methods of treatment have been discussed with the patient and family. After consideration of risks, benefits and other options for treatment, the patient has consented to  Procedure(s): LAPAROSCOPIC CHOLECYSTECTOMY WITH INTRAOPERATIVE CHOLANGIOGRAM (N/A) as a surgical intervention .  The patient's history has been reviewed, patient examined, no change in status, stable for surgery.  I have reviewed the patient's chart and labs.  Questions were answered to the patient's satisfaction.     TOTH III,Kacy Conely S

## 2012-09-21 NOTE — Op Note (Signed)
09/19/2012 - 09/21/2012  9:23 AM  PATIENT:  Parker Berg  47 y.o. male  PRE-OPERATIVE DIAGNOSIS:  cholecystitis  POST-OPERATIVE DIAGNOSIS:  cholecystitis with biliary sludge  PROCEDURE:  Procedure(s): LAPAROSCOPIC CHOLECYSTECTOMY WITH INTRAOPERATIVE CHOLANGIOGRAM (N/A)  SURGEON:  Surgeon(s) and Role:    * Robyne Askew, MD - Primary  PHYSICIAN ASSISTANT:   ASSISTANTS: none   ANESTHESIA:   general  EBL:  Total I/O In: 800 [I.V.:800] Out: 50 [Blood:50]  BLOOD ADMINISTERED:none  DRAINS: none   LOCAL MEDICATIONS USED:  MARCAINE     SPECIMEN:  Source of Specimen:  gallbladder  DISPOSITION OF SPECIMEN:  PATHOLOGY  COUNTS:  YES  TOURNIQUET:  * No tourniquets in log *  DICTATION: .Dragon Dictation  Procedure: After informed consent was obtained the patient was brought to the operating room and placed in the supine position on the operating room table. After adequate induction of general anesthesia the patient's abdomen was prepped with ChloraPrep allowed to dry and draped in usual sterile manner. The area below the umbilicus was infiltrated with quarter percent  Marcaine. A small incision was made with a 15 blade knife. The incision was carried down through the subcutaneous tissue bluntly with a hemostat and [employer] retractors. The linea alba was identified. The linea alba was incised with a 15 blade knife and each side was grasped with Coker clamps. The preperitoneal space was then probed with a hemostat until the peritoneum was opened and access was gained to the abdominal cavity. A 0 Vicryl pursestring stitch was placed in the fascia surrounding the opening. A Hassan cannula was then placed through the opening and anchored in place with the previously placed Vicryl purse string stitch. The abdomen was insufflated with carbon dioxide without difficulty. A laparoscope was inserted through the [facility] cannula in the right upper quadrant was inspected. Next the epigastric region was  infiltrated with % Marcaine. A small incision was made with a 15 blade knife. A 10 mm port was placed bluntly through this incision into the abdominal cavity under direct vision. Next 2 sites were chosen laterally on the right side of the abdomen for placement of 5 mm ports. Each of these areas was infiltrated with quarter percent Marcaine. Small stab incisions were made with a 15 blade knife. 5 mm ports were then placed bluntly through these incisions into the abdominal cavity under direct vision without difficulty. A blunt grasper was placed through the lateralmost 5 mm port and used to grasp the dome of the gallbladder and elevated anteriorly and superiorly. Another blunt grasper was placed through the other 5 mm port and used to retract the body and neck of the gallbladder. A dissector was placed through the epigastric port and using the electrocautery the peritoneal reflection at the gallbladder neck was opened. Blunt dissection was then carried out in this area until the gallbladder neck-cystic duct junction was readily identified and a good window was created. A single clip was placed on the gallbladder neck. A small  ductotomy was made just below the clip with laparoscopic scissors. A 14-gauge Angiocath was then placed through the anterior abdominal wall under direct vision. A Reddick cholangiogram catheter was then placed through the Angiocath and flushed. The catheter was then placed in the cystic duct and anchored in place with a clip. A cholangiogram was obtained that showed no filling defects good emptying into the duodenum an adequate length on the cystic duct. The anchoring clip and catheters were then removed from the patient. 3 clips  were placed proximally on the cystic duct and the duct was divided between the 2 sets of clips. Posterior to this the cystic artery was identified and again dissected bluntly in a circumferential manner until a good window  was created. 2 clips were placed proximally  and one distally on the artery and the artery was divided between the 2 sets of clips. Next a laparoscopic hook cautery device was used to separate the gallbladder from the liver bed. Prior to completely detaching the gallbladder from the liver bed the liver bed was inspected and several small bleeding points were coagulated with the electrocautery until the area was completely hemostatic. The gallbladder was then detached the rest of it from the liver bed without difficulty. A laparoscopic bag was inserted through the epigastric port. The gallbladder was placed within the bag and the bag was sealed. A laparoscope was then moved to the epigastric port. The gallbladder grasper was placed through the [facility] cannula and used to grasp the opening of the bag. The bag with the gallbladder was then removed with the [facility] cannula through the infraumbilical port without difficulty. The fascial defect was then closed with the previously placed Vicryl pursestring stitch as well as with another figure-of-eight 0 Vicryl stitch. The liver bed was inspected again and found to be hemostatic. The abdomen was irrigated with copious amounts of saline until the effluent was clear. The ports were then removed under direct vision without difficulty and were found to be hemostatic. The gas was allowed to escape. The skin incisions were all closed with interrupted 4-0 Monocryl subcuticular stitches. Dermabond dressings were applied. The patient tolerated the procedure well. At the end of the case all needle sponge and instrument counts were correct. The patient was then awakened and taken to recovery in stable condition  PLAN OF CARE: Admit for overnight observation  PATIENT DISPOSITION:  PACU - hemodynamically stable.   Delay start of Pharmacological VTE agent (>24hrs) due to surgical blood loss or risk of bleeding: not applicable

## 2012-09-21 NOTE — OR Nursing (Signed)
Pt is resting quietly. Much more relaxed...oriented to person, place, and situation. Will return to 6 N. Accompanied by guard.

## 2012-09-21 NOTE — OR Nursing (Signed)
Pt has become very anxious and disoriented. Still having some pain, but refuses pain meds ...says they are making him feel strange. Tearful. Anesthesia aware. Will give Valium as ordered, and reassess.

## 2012-09-21 NOTE — ED Provider Notes (Addendum)
I saw and evaluated the patient, reviewed the resident's note and I agree with the findings and plan.  Pt with no prior h/o biliary disease presents with several day history of upper epigastric pain, inability to tolerate PO's, especially after attempting to eat, n/v and appears clinically dehydrated.  Lab tests confirm dehydration, electrolyte losses.  U/S suggestive of possible cholecystitis, and with elevated WBC and concernign exam with + RUQ tenderness, will consult general surgery for evaluation and likely admission for suspected cholecystitis.  ECG on 5/16 shows NSR with LVH by voltaege.  Likely early repol abn with inferior T wave abn's.  Normal axis, otherwise normal intervals.  Abn ECG with likely no ischemic changes.  No priors for comparison.  Gavin Pound. Oletta Lamas, MD 09/21/12 1422  Gavin Pound. Sachiko Methot, MD 09/21/12 1424

## 2012-09-21 NOTE — Anesthesia Procedure Notes (Signed)
Procedure Name: Intubation Date/Time: 09/21/2012 8:30 AM Performed by: Jerilee Hoh Pre-anesthesia Checklist: Patient identified, Emergency Drugs available, Suction available and Patient being monitored Patient Re-evaluated:Patient Re-evaluated prior to inductionOxygen Delivery Method: Circle system utilized Preoxygenation: Pre-oxygenation with 100% oxygen Intubation Type: IV induction Ventilation: Mask ventilation without difficulty Laryngoscope Size: Mac and 4 Grade View: Grade II Tube type: Oral Tube size: 7.5 mm Number of attempts: 1 Airway Equipment and Method: Stylet Placement Confirmation: ETT inserted through vocal cords under direct vision,  positive ETCO2 and breath sounds checked- equal and bilateral Secured at: 22 cm Tube secured with: Tape Dental Injury: Teeth and Oropharynx as per pre-operative assessment

## 2012-09-22 ENCOUNTER — Encounter (HOSPITAL_COMMUNITY): Payer: Self-pay | Admitting: General Surgery

## 2012-09-22 MED ORDER — OXYCODONE-ACETAMINOPHEN 5-325 MG PO TABS: 1.0000 | ORAL_TABLET | ORAL | Status: DC | PRN

## 2012-09-22 NOTE — Discharge Summary (Signed)
Physician Discharge Summary  Patient ID: Parker Berg MRN: [id number] DOB/AGE: 1965/10/16 47 y.o.  Admit date: 09/19/2012 Discharge date: 09/22/2012  Admission Diagnoses: acute cholecystitis  Discharge Diagnoses: s/p Lap chole Active Problems:   * No active hospital problems. *   Discharged Condition: good  Hospital Course: Pt was admitted and operated on 2/2 to acute cholecystitis.  Please see op note for full details.  Pt did well post op.  He was started on CLD and adv to a regular diet and tolerated that well.  He had good bowel and bladder function.  He had good pain control, was afebrile, and was deemed stable for DC and DC'd home.  Consults: None  Significant Diagnostic Studies: none  Treatments: surgery: 09/21/2012  Discharge Exam: Blood pressure 129/81, pulse 74, temperature 98.1 F (36.7 C), temperature source Oral, resp. rate 18, height 5\' 7"  (1.702 m), weight 133 lb (60.328 kg), SpO2 100.00%. General appearance: alert and cooperative GI: soft, non-tender; bowel sounds normal; no masses,  no organomegaly, wounds c/d/i  Disposition: 01-Home or Self Care     Medication List    TAKE these medications       oxyCODONE-acetaminophen 5-325 MG per tablet  Commonly known as:  PERCOCET/ROXICET  Take 1-2 tablets by mouth every 4 (four) hours as needed.     PRILOSEC PO  Take 1 tablet by mouth daily.     promethazine 25 MG tablet  Commonly known as:  PHENERGAN  Take 25 mg by mouth every 6 (six) hours as needed. Nausea     ranitidine 150 MG tablet  Commonly known as:  ZANTAC  Take 150 mg by mouth 2 (two) times daily.         Signed: Marigene Ehlers., Shamika Pedregon 09/22/2012, 8:58 AM

## 2012-09-22 NOTE — Progress Notes (Signed)
1 Day Post-Op  Subjective: Pt doing well.  Tol PO.  Pain well controlled.  Objective: Vital signs in last 24 hours: Temp:  [97.7 F (36.5 C)-98.2 F (36.8 C)] 98.1 F (36.7 C) (05/19 0437) Pulse Rate:  [57-75] 74 (05/19 0437) Resp:  [13-23] 18 (05/19 0437) BP: (116-152)/(69-96) 129/81 mmHg (05/19 0437) SpO2:  [98 %-100 %] 100 % (05/19 0437) Last BM Date: 09/17/12  Intake/Output from previous day: 05/18 0701 - 05/19 0700 In: 3468.3 [P.O.:360; I.V.:3108.3] Out: 50 [Blood:50] Intake/Output this shift:    General appearance: alert and cooperative GI: soft, non-tender; bowel sounds normal; no masses,  no organomegaly, wounds c/d/i  Lab Results:   Recent Labs  09/20/12 0451 09/21/12 0602  WBC 9.1 7.7  HGB 16.0 13.3  HCT 45.8 37.4*  PLT 230 207   BMET  Recent Labs  09/20/12 0451 09/21/12 0602  NA 134* 132*  K 2.8* 3.7  CL 91* 98  CO2 37* 25  GLUCOSE 117* 104*  BUN 32* 13  CREATININE 1.48* 1.16  CALCIUM 8.9 8.5   PT/INR No results found for this basename: LABPROT, INR,  in the last 72 hours ABG No results found for this basename: PHART, PCO2, PO2, HCO3,  in the last 72 hours  Studies/Results: Dg Cholangiogram Operative  09/21/2012   *RADIOLOGY REPORT*  Clinical Data: Liver.  Cholecystectomy.  INTRAOPERATIVE CHOLANGIOGRAM  Technique:  Multiple fluoroscopic spot radiographs were obtained during intraoperative cholangiogram and are submitted for interpretation post-operatively.  Comparison: 09/19/2012 ultrasound  Findings: A run of 55 images and a single image from intraoperative cholangiogram are submitted.  No contrast extravasation.  No persistent filling defect to suggest choledocholithiasis.  Free spill of contrast into the descending duodenum.  Minimal reflux into the pancreatic duct.  IMPRESSION: No evidence choledocholithiasis.   Original Report Authenticated By: Jeronimo Greaves, M.D.    Anti-infectives: Anti-infectives   Start     Dose/Rate Route Frequency  Ordered Stop   09/21/12 0830  ciprofloxacin (CIPRO) IVPB 400 mg     400 mg 200 mL/hr over 60 Minutes Intravenous To Surgery 09/21/12 0829 09/21/12 0835   09/20/12 0900  ciprofloxacin (CIPRO) IVPB 400 mg     400 mg 200 mL/hr over 60 Minutes Intravenous Every 12 hours 09/20/12 0809        Assessment/Plan: s/p Procedure(s): LAPAROSCOPIC CHOLECYSTECTOMY WITH INTRAOPERATIVE CHOLANGIOGRAM (N/A) Discharge   LOS: 3 days    Marigene Ehlers., [facility] 09/22/2012

## 2012-09-22 NOTE — Progress Notes (Signed)
Came to restart PIV however pt requesting to have it done "after I finish my breakfast".  Primary RN made aware and I will check back later.   Guard at bedside.

## 2012-09-22 NOTE — Progress Notes (Signed)
Pt discharged to home

## 2012-10-04 MED ORDER — NALOXONE HCL 0.4 MG/ML IJ SOLN
INTRAMUSCULAR | Status: AC
  Filled 2012-10-04: qty 1

## 2012-12-01 ENCOUNTER — Encounter (HOSPITAL_COMMUNITY): Payer: Self-pay

## 2012-12-01 ENCOUNTER — Emergency Department (HOSPITAL_COMMUNITY): Payer: Self-pay

## 2012-12-01 ENCOUNTER — Emergency Department (HOSPITAL_COMMUNITY)
Admission: EM | Admit: 2012-12-01 | Discharge: 2012-12-01 | Disposition: A | Discharge status: Home / Self Care | Payer: Self-pay | Attending: Emergency Medicine | Admitting: Emergency Medicine

## 2012-12-01 ENCOUNTER — Emergency Department (HOSPITAL_COMMUNITY)
Admission: EM | Admit: 2012-12-01 | Discharge: 2012-12-02 | Disposition: A | Discharge status: Home / Self Care | Payer: Self-pay | Attending: Emergency Medicine | Admitting: Emergency Medicine

## 2012-12-01 ENCOUNTER — Encounter (HOSPITAL_COMMUNITY): Payer: Self-pay | Admitting: *Deleted

## 2012-12-01 DIAGNOSIS — F17200 Nicotine dependence, unspecified, uncomplicated: Secondary | ICD-10-CM | POA: Insufficient documentation

## 2012-12-01 DIAGNOSIS — K5289 Other specified noninfective gastroenteritis and colitis: Secondary | ICD-10-CM | POA: Insufficient documentation

## 2012-12-01 DIAGNOSIS — K920 Hematemesis: Secondary | ICD-10-CM | POA: Insufficient documentation

## 2012-12-01 DIAGNOSIS — Z9089 Acquired absence of other organs: Secondary | ICD-10-CM | POA: Insufficient documentation

## 2012-12-01 DIAGNOSIS — R109 Unspecified abdominal pain: Secondary | ICD-10-CM | POA: Insufficient documentation

## 2012-12-01 DIAGNOSIS — K529 Noninfective gastroenteritis and colitis, unspecified: Secondary | ICD-10-CM

## 2012-12-01 DIAGNOSIS — K219 Gastro-esophageal reflux disease without esophagitis: Secondary | ICD-10-CM | POA: Insufficient documentation

## 2012-12-01 DIAGNOSIS — Z79899 Other long term (current) drug therapy: Secondary | ICD-10-CM | POA: Insufficient documentation

## 2012-12-01 LAB — URINALYSIS, ROUTINE W REFLEX MICROSCOPIC
Glucose, UA: NEGATIVE mg/dL
Hgb urine dipstick: NEGATIVE
Ketones, ur: 15 mg/dL — AB
Leukocytes, UA: NEGATIVE
Nitrite: NEGATIVE
Protein, ur: 100 mg/dL — AB
Specific Gravity, Urine: 1.034 — ABNORMAL HIGH (ref 1.005–1.030)
Urobilinogen, UA: 1 mg/dL (ref 0.0–1.0)
pH: 5.5 (ref 5.0–8.0)

## 2012-12-01 LAB — CBC WITH DIFFERENTIAL/PLATELET
Basophils Absolute: 0 10*3/uL (ref 0.0–0.1)
Basophils Relative: 0 % (ref 0–1)
Eosinophils Absolute: 0.1 10*3/uL (ref 0.0–0.7)
Eosinophils Relative: 0 % (ref 0–5)
HCT: 51.1 % (ref 39.0–52.0)
Hemoglobin: 18 g/dL — ABNORMAL HIGH (ref 13.0–17.0)
Lymphocytes Relative: 14 % (ref 12–46)
Lymphs Abs: 1.8 10*3/uL (ref 0.7–4.0)
MCH: 32.7 pg (ref 26.0–34.0)
MCHC: 35.2 g/dL (ref 30.0–36.0)
MCV: 92.7 fL (ref 78.0–100.0)
Monocytes Absolute: 0.7 10*3/uL (ref 0.1–1.0)
Monocytes Relative: 5 % (ref 3–12)
Neutro Abs: 10.7 10*3/uL — ABNORMAL HIGH (ref 1.7–7.7)
Neutrophils Relative %: 80 % — ABNORMAL HIGH (ref 43–77)
Platelets: 401 10*3/uL — ABNORMAL HIGH (ref 150–400)
RBC: 5.51 MIL/uL (ref 4.22–5.81)
RDW: 14.6 % (ref 11.5–15.5)
WBC: 13.3 10*3/uL — ABNORMAL HIGH (ref 4.0–10.5)

## 2012-12-01 LAB — COMPREHENSIVE METABOLIC PANEL
ALT: 14 U/L (ref 0–53)
AST: 21 U/L (ref 0–37)
Albumin: 4.8 g/dL (ref 3.5–5.2)
Alkaline Phosphatase: 116 U/L (ref 39–117)
BUN: 27 mg/dL — ABNORMAL HIGH (ref 6–23)
CO2: 22 mEq/L (ref 19–32)
Calcium: 10.3 mg/dL (ref 8.4–10.5)
Chloride: 95 mEq/L — ABNORMAL LOW (ref 96–112)
Creatinine, Ser: 1.54 mg/dL — ABNORMAL HIGH (ref 0.50–1.35)
GFR calc Af Amer: 61 mL/min — ABNORMAL LOW (ref >90)
GFR calc non Af Amer: 53 mL/min — ABNORMAL LOW (ref >90)
Glucose, Bld: 145 mg/dL — ABNORMAL HIGH (ref 70–99)
Potassium: 3.7 mEq/L (ref 3.5–5.1)
Sodium: 138 mEq/L (ref 135–145)
Total Bilirubin: 1.2 mg/dL (ref 0.3–1.2)
Total Protein: 9.1 g/dL — ABNORMAL HIGH (ref 6.0–8.3)

## 2012-12-01 LAB — LIPASE, BLOOD: Lipase: 16 U/L (ref 11–59)

## 2012-12-01 LAB — URINE MICROSCOPIC-ADD ON

## 2012-12-01 MED ORDER — ONDANSETRON HCL 4 MG/2ML IJ SOLN
4.0000 mg | Freq: Once | INTRAMUSCULAR | Status: AC
  Administered 2012-12-01: 4 mg via INTRAVENOUS
  Filled 2012-12-01: qty 2

## 2012-12-01 MED ORDER — MORPHINE SULFATE 4 MG/ML IJ SOLN
4.0000 mg | Freq: Once | INTRAMUSCULAR | Status: AC
  Administered 2012-12-02: 4 mg via INTRAVENOUS
  Filled 2012-12-01: qty 1

## 2012-12-01 MED ORDER — OXYCODONE-ACETAMINOPHEN 5-325 MG PO TABS: 1.0000 | ORAL_TABLET | Freq: Four times a day (QID) | ORAL | Status: DC | PRN

## 2012-12-01 MED ORDER — IOHEXOL 300 MG/ML  SOLN
25.0000 mL | INTRAMUSCULAR | Status: AC
  Administered 2012-12-01 (×2): 25 mL via ORAL

## 2012-12-01 MED ORDER — MORPHINE SULFATE 4 MG/ML IJ SOLN
4.0000 mg | Freq: Once | INTRAMUSCULAR | Status: AC
  Administered 2012-12-01: 4 mg via INTRAVENOUS
  Filled 2012-12-01: qty 1

## 2012-12-01 MED ORDER — PROMETHAZINE HCL 25 MG PO TABS: 25.0000 mg | ORAL_TABLET | Freq: Four times a day (QID) | ORAL | Status: DC | PRN

## 2012-12-01 MED ORDER — ONDANSETRON HCL 4 MG/2ML IJ SOLN
4.0000 mg | Freq: Once | INTRAMUSCULAR | Status: AC
  Administered 2012-12-02: 4 mg via INTRAVENOUS
  Filled 2012-12-01: qty 2

## 2012-12-01 MED ORDER — CIPROFLOXACIN HCL 500 MG PO TABS: 500.0000 mg | ORAL_TABLET | Freq: Two times a day (BID) | ORAL | Status: DC

## 2012-12-01 MED ORDER — SODIUM CHLORIDE 0.9 % IV BOLUS (SEPSIS)
1000.0000 mL | Freq: Once | INTRAVENOUS | Status: AC
  Administered 2012-12-01: 1000 mL via INTRAVENOUS

## 2012-12-01 MED ORDER — HYDROMORPHONE HCL PF 1 MG/ML IJ SOLN
1.0000 mg | Freq: Once | INTRAMUSCULAR | Status: AC
  Administered 2012-12-01: 1 mg via INTRAVENOUS
  Filled 2012-12-01: qty 1

## 2012-12-01 MED ORDER — METRONIDAZOLE 500 MG PO TABS: 500.0000 mg | ORAL_TABLET | Freq: Two times a day (BID) | ORAL | Status: DC

## 2012-12-01 MED ORDER — LIDOCAINE VISCOUS 2 % MT SOLN
15.0000 mL | Freq: Once | OROMUCOSAL | Status: AC
  Administered 2012-12-01: 15 mL via OROMUCOSAL
  Filled 2012-12-01: qty 15

## 2012-12-01 MED ORDER — HYDROMORPHONE HCL PF 1 MG/ML IJ SOLN: 1.0000 mg | Freq: Once | INTRAMUSCULAR | Status: DC

## 2012-12-01 NOTE — ED Notes (Signed)
ZOX:[id number]<AV> Expected date:12/01/12<BR> Expected time:10:14 PM<BR> Means of arrival:Ambulance<BR> Comments:<BR>

## 2012-12-01 NOTE — ED Notes (Signed)
EMS [place] [facility] this AM due to PT complaint of N/V. EMS inserted a IV and gave 4 MG of Zofran for N/V . Pt  Reports to be 10 days post OP gallbladder.

## 2012-12-01 NOTE — ED Notes (Signed)
Pt presents via EMS with c/o nausea and vomiting. Pt was seen [facility] earlier today and d/c with a GI bug. Pt was given two prescriptions for antibiotics and nausea medicine. Pt was unable to get these rx filled. Pt says he came back to the ER because he is now coughing up blood. Per EMS, no blood was seen while pt was vomiting en route.

## 2012-12-01 NOTE — ED Provider Notes (Signed)
CSN: [id number]     Arrival date & time 12/01/12  0902 History     First MD Initiated Contact with Patient 12/01/12 [phone]     Chief Complaint  Patient presents with  . Emesis  . Nausea   (Consider location/radiation/quality/duration/timing/severity/associated sxs/prior Treatment) HPI Patient presents emergency department with nausea, vomiting, and abdominal pain.  The patient, states, that the symptoms started one week ago.  Patient denies chest pain, shortness of breath, headache, blurred vision, weakness, numbness, dizziness, fever, dysuria, back pain, rash or syncope.  Patient, states, that nothing seems make his condition, better.  However, palpation makes his pain, worse.  Patient, states, that he had a gallbladder in May.  Patient, states he did not take any medications prior to arrival. Past Surgical History  Procedure Laterality Date  . Cholecystectomy N/A 09/21/2012    Procedure: LAPAROSCOPIC CHOLECYSTECTOMY WITH INTRAOPERATIVE CHOLANGIOGRAM;  Surgeon: Robyne Askew, MD;  Location: [facility] OR;  Service: General;  Laterality: N/A;   No family history on file. History  Substance Use Topics  . Smoking status: Current Every Day Smoker    Types: Cigarettes  . Smokeless tobacco: Not on file  . Alcohol Use: Yes     Comment: normally    Review of Systems All other systems negative except as documented in the HPI. All pertinent positives and negatives as reviewed in the HPI. Allergies  Review of patient's allergies indicates no known allergies.  Home Medications   Current Outpatient Rx  Name  Route  Sig  Dispense  Refill  . ranitidine (ZANTAC) 150 MG tablet   Oral   Take 150 mg by mouth 2 (two) times daily.          BP 137/91  Pulse 68  Temp(Src) 98.2 F (36.8 C) (Oral)  Resp 13  SpO2 98% Physical Exam  Nursing note and vitals reviewed. Constitutional: He is oriented to person, place, and time. He appears well-developed and well-nourished. No distress.  HENT:  Head:  Normocephalic and atraumatic.  Mouth/Throat: Oropharynx is clear and moist.  Eyes: Pupils are equal, round, and reactive to light.  Neck: Normal range of motion. Neck supple.  Cardiovascular: Normal rate and regular rhythm.  Exam reveals no gallop and no friction rub.   No murmur heard. Pulmonary/Chest: Effort normal and breath sounds normal. No respiratory distress.  Abdominal: Normal appearance and bowel sounds are normal. No hernia.    Neurological: He is alert and oriented to person, place, and time. He exhibits normal muscle tone. Coordination normal.  Skin: Skin is warm and dry.    ED Course   Procedures (including critical care time)  Labs Reviewed  CBC WITH DIFFERENTIAL - Abnormal; Notable for the following:    WBC 13.3 (*)    Hemoglobin 18.0 (*)    Platelets 401 (*)    Neutrophils Relative % 80 (*)    Neutro Abs 10.7 (*)    All other components within normal limits  COMPREHENSIVE METABOLIC PANEL - Abnormal; Notable for the following:    Chloride 95 (*)    Glucose, Bld 145 (*)    BUN 27 (*)    Creatinine, Ser 1.54 (*)    Total Protein 9.1 (*)    GFR calc non Af Amer 53 (*)    GFR calc Af Amer 61 (*)    All other components within normal limits  URINALYSIS, ROUTINE W REFLEX MICROSCOPIC - Abnormal; Notable for the following:    Color, Urine AMBER (*)  APPearance CLOUDY (*)    Specific Gravity, Urine 1.034 (*)    Bilirubin Urine MODERATE (*)    Ketones, ur 15 (*)    Protein, ur 100 (*)    All other components within normal limits  URINE MICROSCOPIC-ADD ON - Abnormal; Notable for the following:    Casts HYALINE CASTS (*)    All other components within normal limits  LIPASE, BLOOD   Ct Abdomen Pelvis Wo Contrast  12/01/2012   *RADIOLOGY REPORT*  Clinical Data: 47 year old male with abdominal and pelvic pain, nausea and vomiting.  Postop cholecystectomy 10 days ago.  CT ABDOMEN AND PELVIS WITHOUT CONTRAST  Technique:  Multidetector CT imaging of the abdomen and  pelvis was performed following the standard protocol without intravenous contrast.  Comparison: 03/29/2012 CT  Findings: The liver, spleen, kidneys, adrenal glands and pancreas are unremarkable. The patient is status post cholecystectomy.  Please note that parenchymal abnormalities may be missed as intravenous contrast was not administered.  No free fluid, enlarged lymph nodes, biliary dilation or abdominal aortic aneurysm identified.  There is a suggestion of a thick-walled small bowel within the left abdomen - an enteritis is not excluded.  There is no evidence of bowel obstruction or pneumoperitoneum.  The bladder is within normal limits. No acute or suspicious bony abnormalities are identified. A surgical clip is noted within the very low right anterior pelvis towards inguinal canal and likely migrated from the cholecystectomy bed.  No acute or suspicious bony abnormalities are noted.  IMPRESSION: Question thick-walled small bowel within the left abdomen which may represent an enteritis.  No evidence of bowel obstruction or pneumoperitoneum.  Status post cholecystectomy. A surgical clip inferiorly in the right pelvis near the inguinal canal is noted and could represent a migrated clip.   Original Report Authenticated By: Harmon Pier, M.D.   Patient will be treated for possible enteritis.  Patient is advised to increase his fluid intake.  Return here as needed.  Patient received IV fluids and antiemetics.  Patient, states, that he's feeling better and tolerated oral fluids.  Told to followup with his primary care Dr. return here as needed  MDM  MDM Reviewed: nursing note and vitals Interpretation: labs and CT scan      Carlyle Dolly, PA-C 12/03/12 2002

## 2012-12-01 NOTE — ED Notes (Signed)
Pt finished drinking contrast. CT notified. 

## 2012-12-02 ENCOUNTER — Emergency Department (HOSPITAL_COMMUNITY): Payer: Self-pay

## 2012-12-02 LAB — CBC
HCT: 49.5 % (ref 39.0–52.0)
Hemoglobin: 17 g/dL (ref 13.0–17.0)
Hemoglobin: 14.9 g/dL (ref 13.0–17.0)
MCH: 31.4 pg (ref 26.0–34.0)
MCHC: 34.3 g/dL (ref 30.0–36.0)
MCV: 93.9 fL (ref 78.0–100.0)
Platelets: 310 10*3/uL (ref 150–400)
RBC: 5.31 MIL/uL (ref 4.22–5.81)
RBC: 4.75 MIL/uL (ref 4.22–5.81)
WBC: 17.3 10*3/uL — ABNORMAL HIGH (ref 4.0–10.5)
WBC: 12.5 10*3/uL — ABNORMAL HIGH (ref 4.0–10.5)

## 2012-12-02 LAB — OCCULT BLOOD GASTRIC / DUODENUM (SPECIMEN CUP)
Occult Blood, Gastric: POSITIVE — AB
pH, Gastric: 4

## 2012-12-02 MED ORDER — SODIUM CHLORIDE 0.9 % IV SOLN
80.0000 mg | Freq: Once | INTRAVENOUS | Status: AC
  Administered 2012-12-02: 80 mg via INTRAVENOUS
  Filled 2012-12-02: qty 80

## 2012-12-02 MED ORDER — ONDANSETRON HCL 4 MG/2ML IJ SOLN
4.0000 mg | Freq: Once | INTRAMUSCULAR | Status: AC
  Administered 2012-12-02: 4 mg via INTRAVENOUS
  Filled 2012-12-02: qty 2

## 2012-12-02 MED ORDER — LORAZEPAM 2 MG/ML IJ SOLN
1.0000 mg | Freq: Once | INTRAMUSCULAR | Status: AC
  Administered 2012-12-02: 1 mg via INTRAVENOUS
  Filled 2012-12-02: qty 1

## 2012-12-02 MED ORDER — ONDANSETRON 4 MG PO TBDP: 4.0000 mg | ORAL_TABLET | Freq: Once | ORAL | Status: DC

## 2012-12-02 MED ORDER — SODIUM CHLORIDE 0.9 % IV BOLUS (SEPSIS)
1000.0000 mL | Freq: Once | INTRAVENOUS | Status: AC
  Administered 2012-12-02: 1000 mL via INTRAVENOUS

## 2012-12-02 NOTE — ED Provider Notes (Signed)
Medical screening examination/treatment/procedure(s) were performed by non-physician practitioner and as supervising physician I was immediately available for consultation/collaboration.   Hanley Seamen, MD 12/02/12 [phone]

## 2012-12-02 NOTE — ED Notes (Signed)
Pt able to keep cranberry juice down.

## 2012-12-02 NOTE — ED Notes (Signed)
Pt unable to keep fluids down. Failed fluid challenge. 

## 2012-12-02 NOTE — ED Provider Notes (Signed)
CSN: [id number]     Arrival date & time 12/01/12  2215 History     First MD Initiated Contact with Patient 12/01/12 2247     Chief Complaint  Patient presents with  . Vomiting    (Consider location/radiation/quality/duration/timing/severity/associated sxs/prior Treatment) HPI  Parker Berg is a 47 y.o. male complaining of complaining of nausea vomiting and blood streaked emesis. Patient was seen yesterday and CT scan showed enteritis. Patient was given friction for Cipro Flagyl and Phenergan however he could not fill. After patient left he started vomiting again. Patient denies fever, diarrhea, he endorses a diffuse abdominal pain rated at 7/10, described as crampy, exacerbated after emesis.   Past Medical History  Diagnosis Date  . GERD (gastroesophageal reflux disease)    Past Surgical History  Procedure Laterality Date  . Cholecystectomy N/A 09/21/2012    Procedure: LAPAROSCOPIC CHOLECYSTECTOMY WITH INTRAOPERATIVE CHOLANGIOGRAM;  Surgeon: Robyne Askew, MD;  Location: [facility] OR;  Service: General;  Laterality: N/A;   No family history on file. History  Substance Use Topics  . Smoking status: Current Every Day Smoker    Types: Cigarettes  . Smokeless tobacco: Not on file  . Alcohol Use: Yes     Comment: normally    Review of Systems 10 systems reviewed and found to be negative, except as noted in the HPI   Allergies  Review of patient's allergies indicates no known allergies.  Home Medications   Current Outpatient Rx  Name  Route  Sig  Dispense  Refill  . ciprofloxacin (CIPRO) 500 MG tablet   Oral   Take 1 tablet (500 mg total) by mouth every 12 (twelve) hours.   10 tablet   0   . metroNIDAZOLE (FLAGYL) 500 MG tablet   Oral   Take 1 tablet (500 mg total) by mouth 2 (two) times daily.   14 tablet   0   . oxyCODONE-acetaminophen (PERCOCET/ROXICET) 5-325 MG per tablet   Oral   Take 1-2 tablets by mouth every 6 (six) hours as needed for pain.   20 tablet   0    . promethazine (PHENERGAN) 25 MG tablet   Oral   Take 1 tablet (25 mg total) by mouth every 6 (six) hours as needed for nausea.   10 tablet   0   . ranitidine (ZANTAC) 150 MG tablet   Oral   Take 150 mg by mouth 2 (two) times daily.          BP 130/90  Pulse 98  Temp(Src) 98.6 F (37 C) (Oral)  Resp 16  SpO2 98% Physical Exam  Nursing note and vitals reviewed. Constitutional: He is oriented to person, place, and time. He appears well-developed and well-nourished. No distress.  HENT:  Head: Normocephalic and atraumatic.  Mouth/Throat: Oropharynx is clear and moist.  Eyes: Conjunctivae and EOM are normal.  Cardiovascular: Normal rate.   Pulmonary/Chest: Effort normal and breath sounds normal. No stridor. No respiratory distress. He has no wheezes. He has no rales. He exhibits no tenderness.  Abdominal: Soft. Bowel sounds are normal. He exhibits no distension and no mass. There is tenderness. There is no rebound and no guarding.  Tender palpation the epigastrium with no guarding or rebound.  Musculoskeletal: Normal range of motion.  Patient has tracking bracelet on ankle  Neurological: He is alert and oriented to person, place, and time.  Psychiatric: He has a normal mood and affect.    ED Course   Procedures (including critical care time)  Labs Reviewed  OCCULT BLOOD GASTRIC / DUODENUM (SPECIMEN CUP) - Abnormal; Notable for the following:    Occult Blood, Gastric POSITIVE (*)    All other components within normal limits  CBC - Abnormal; Notable for the following:    WBC 17.3 (*)    Platelets 422 (*)    All other components within normal limits   Ct Abdomen Pelvis Wo Contrast  12/01/2012   *RADIOLOGY REPORT*  Clinical Data: 47 year old male with abdominal and pelvic pain, nausea and vomiting.  Postop cholecystectomy 10 days ago.  CT ABDOMEN AND PELVIS WITHOUT CONTRAST  Technique:  Multidetector CT imaging of the abdomen and pelvis was performed following the standard  protocol without intravenous contrast.  Comparison: 03/29/2012 CT  Findings: The liver, spleen, kidneys, adrenal glands and pancreas are unremarkable. The patient is status post cholecystectomy.  Please note that parenchymal abnormalities may be missed as intravenous contrast was not administered.  No free fluid, enlarged lymph nodes, biliary dilation or abdominal aortic aneurysm identified.  There is a suggestion of a thick-walled small bowel within the left abdomen - an enteritis is not excluded.  There is no evidence of bowel obstruction or pneumoperitoneum.  The bladder is within normal limits. No acute or suspicious bony abnormalities are identified. A surgical clip is noted within the very low right anterior pelvis towards inguinal canal and likely migrated from the cholecystectomy bed.  No acute or suspicious bony abnormalities are noted.  IMPRESSION: Question thick-walled small bowel within the left abdomen which may represent an enteritis.  No evidence of bowel obstruction or pneumoperitoneum.  Status post cholecystectomy. A surgical clip inferiorly in the right pelvis near the inguinal canal is noted and could represent a migrated clip.   Original Report Authenticated By: Harmon Pier, M.D.   Dg Chest 2 View  12/02/2012   *RADIOLOGY REPORT*  Clinical Data: Shortness of breath.  Lower chest and upper abdominal pain.  CHEST - 2 VIEW  Comparison: 04/20/2009  Findings: There is evidence of mild emphysematous lung disease.  No infiltrate, edema, pneumothorax or pulmonary nodules are identified.  There is no evidence of pleural fluid.  The heart size and mediastinal contours are within normal limits.  Bony thorax is unremarkable.  IMPRESSION: Mild emphysema.  No acute findings.   Original Report Authenticated By: Irish Lack, M.D.   1. Enteritis     MDM   Filed Vitals:   12/01/12 2221  BP: 130/90  Pulse: 98  Temp: 98.6 F (37 C)  TempSrc: Oral  Resp: 16  SpO2: 98%     Parker Berg is a 47  y.o. male with multiple episodes of nausea and vomiting,  [facility] and [facility] in the [facility]. Patient did not fill his prescriptions and continued to vomit. He states that the emesis turned bloody. Likely not a wise care due to multiple episodes of emesis. His CT from yesterday shows a mild small bowel thickening, no other abnormalities. Lipase is normal and he had a mild leukocytosis at the time. Blood work is otherwise unremarkable.  Patient is tolerating by mouth, reports improved abdominal pain is in a mental to discharge at this time.  Medications  ondansetron (ZOFRAN) injection 4 mg (4 mg Intravenous Given 12/02/12 0110)  morphine 4 MG/ML injection 4 mg (4 mg Intravenous Given 12/02/12 0112)  pantoprazole (PROTONIX) 80 mg in sodium chloride 0.9 % 100 mL IVPB (0 mg Intravenous Stopped 12/02/12 0214)    Pt is hemodynamically stable, appropriate for, and amenable  to discharge at this time. Pt verbalized understanding and agrees with care plan. All questions answered. Outpatient follow-up and specific return precautions discussed.    Note: Portions of this report may have been transcribed using voice recognition software. Every effort was made to ensure accuracy; however, inadvertent computerized transcription errors may be present    Wynetta Emery, PA-C 12/02/12 [phone]

## 2012-12-04 NOTE — ED Provider Notes (Signed)
Medical screening examination/treatment/procedure(s) were performed by non-physician practitioner and as supervising physician I was immediately available for consultation/collaboration.   Laquinn Shippy, MD 12/04/12 1550 

## 2013-03-30 ENCOUNTER — Encounter (INDEPENDENT_AMBULATORY_CARE_PROVIDER_SITE_OTHER): Payer: Self-pay | Admitting: General Surgery

## 2013-05-21 ENCOUNTER — Encounter (HOSPITAL_COMMUNITY): Payer: Self-pay | Admitting: Emergency Medicine

## 2013-05-21 ENCOUNTER — Emergency Department (HOSPITAL_COMMUNITY)
Admission: EM | Admit: 2013-05-21 | Discharge: 2013-05-22 | Disposition: A | Discharge status: Other Acute Inpatient Hospital | Payer: No Typology Code available for payment source | Attending: Emergency Medicine | Admitting: Emergency Medicine

## 2013-05-21 DIAGNOSIS — F411 Generalized anxiety disorder: Secondary | ICD-10-CM | POA: Insufficient documentation

## 2013-05-21 DIAGNOSIS — Z8719 Personal history of other diseases of the digestive system: Secondary | ICD-10-CM | POA: Insufficient documentation

## 2013-05-21 DIAGNOSIS — R443 Hallucinations, unspecified: Secondary | ICD-10-CM | POA: Insufficient documentation

## 2013-05-21 DIAGNOSIS — F1210 Cannabis abuse, uncomplicated: Secondary | ICD-10-CM | POA: Insufficient documentation

## 2013-05-21 DIAGNOSIS — F329 Major depressive disorder, single episode, unspecified: Secondary | ICD-10-CM

## 2013-05-21 DIAGNOSIS — F3289 Other specified depressive episodes: Secondary | ICD-10-CM | POA: Insufficient documentation

## 2013-05-21 DIAGNOSIS — R45851 Suicidal ideations: Secondary | ICD-10-CM | POA: Insufficient documentation

## 2013-05-21 DIAGNOSIS — Z9889 Other specified postprocedural states: Secondary | ICD-10-CM | POA: Insufficient documentation

## 2013-05-21 DIAGNOSIS — F1020 Alcohol dependence, uncomplicated: Secondary | ICD-10-CM | POA: Insufficient documentation

## 2013-05-21 DIAGNOSIS — F17200 Nicotine dependence, unspecified, uncomplicated: Secondary | ICD-10-CM | POA: Insufficient documentation

## 2013-05-21 DIAGNOSIS — F32A Depression, unspecified: Secondary | ICD-10-CM

## 2013-05-21 HISTORY — DX: Depression, unspecified: F32.A

## 2013-05-21 HISTORY — DX: Anxiety disorder, unspecified: F41.9

## 2013-05-21 HISTORY — DX: Major depressive disorder, single episode, unspecified: F32.9

## 2013-05-21 LAB — COMPREHENSIVE METABOLIC PANEL
ALBUMIN: 4 g/dL (ref 3.5–5.2)
ALT: 17 U/L (ref 0–53)
AST: 19 U/L (ref 0–37)
Alkaline Phosphatase: 98 U/L (ref 39–117)
BILIRUBIN TOTAL: 0.5 mg/dL (ref 0.3–1.2)
BUN: 11 mg/dL (ref 6–23)
CHLORIDE: 100 meq/L (ref 96–112)
CO2: 25 mEq/L (ref 19–32)
CREATININE: 0.93 mg/dL (ref 0.50–1.35)
Calcium: 9.8 mg/dL (ref 8.4–10.5)
GFR calc Af Amer: >90 mL/min (ref >90)
GFR calc non Af Amer: >90 mL/min (ref >90)
Glucose, Bld: 92 mg/dL (ref 70–99)
Potassium: 3.7 mEq/L (ref 3.7–5.3)
Sodium: 138 mEq/L (ref 137–147)
TOTAL PROTEIN: 8 g/dL (ref 6.0–8.3)

## 2013-05-21 LAB — RAPID URINE DRUG SCREEN, HOSP PERFORMED
AMPHETAMINES: NOT DETECTED
BENZODIAZEPINES: NOT DETECTED
Barbiturates: NOT DETECTED
COCAINE: NOT DETECTED
OPIATES: NOT DETECTED
Tetrahydrocannabinol: POSITIVE — AB

## 2013-05-21 LAB — ETHANOL: Alcohol, Ethyl (B): <11 mg/dL (ref 0–11)

## 2013-05-21 LAB — ACETAMINOPHEN LEVEL

## 2013-05-21 LAB — CBC
HCT: 45.3 % (ref 39.0–52.0)
Hemoglobin: 15.6 g/dL (ref 13.0–17.0)
MCH: 31.1 pg (ref 26.0–34.0)
MCHC: 34.4 g/dL (ref 30.0–36.0)
MCV: 90.4 fL (ref 78.0–100.0)
PLATELETS: 289 10*3/uL (ref 150–400)
RBC: 5.01 MIL/uL (ref 4.22–5.81)
RDW: 13.2 % (ref 11.5–15.5)
WBC: 9.1 10*3/uL (ref 4.0–10.5)

## 2013-05-21 LAB — SALICYLATE LEVEL

## 2013-05-21 MED ORDER — NICOTINE 21 MG/24HR TD PT24: 21.0000 mg | MEDICATED_PATCH | Freq: Every day | TRANSDERMAL | Status: DC

## 2013-05-21 MED ORDER — LORAZEPAM 1 MG PO TABS: 0.0000 mg | ORAL_TABLET | Freq: Two times a day (BID) | ORAL | Status: DC

## 2013-05-21 MED ORDER — LORAZEPAM 1 MG PO TABS
1.0000 mg | ORAL_TABLET | Freq: Three times a day (TID) | ORAL | Status: DC | PRN
  Administered 2013-05-21: 1 mg via ORAL
  Filled 2013-05-21: qty 1

## 2013-05-21 MED ORDER — LORAZEPAM 1 MG PO TABS
0.0000 mg | ORAL_TABLET | Freq: Four times a day (QID) | ORAL | Status: DC
  Administered 2013-05-21 – 2013-05-22 (×2): 1 mg via ORAL
  Filled 2013-05-21 (×2): qty 1

## 2013-05-21 MED ORDER — ONDANSETRON HCL 4 MG PO TABS: 4.0000 mg | ORAL_TABLET | Freq: Three times a day (TID) | ORAL | Status: DC | PRN
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
## 2013-05-21 NOTE — ED Notes (Signed)
Pt transferred from triage, presents for medical clearance.  Pt SI without specific plan, reports he is experiencing anxiety attacks and Auditory Hallucinations saying, "Hush".  Denies Visual hallucinations.  Reports feeling hopeless.  Denies SI attempts in the past, denies psych hx.  Pt cooperative, interactive, but tearful at times.
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
## 2013-05-22 ENCOUNTER — Inpatient Hospital Stay (HOSPITAL_COMMUNITY)
Admission: AD | Admit: 2013-05-22 | Discharge: 2013-06-01 | DRG: 897 | Disposition: A | Discharge status: Home / Self Care | Payer: No Typology Code available for payment source | Source: Intra-hospital | Attending: Psychiatry | Admitting: Psychiatry

## 2013-05-22 ENCOUNTER — Encounter (HOSPITAL_COMMUNITY): Payer: Self-pay | Admitting: Behavioral Health

## 2013-05-22 DIAGNOSIS — F3289 Other specified depressive episodes: Secondary | ICD-10-CM

## 2013-05-22 DIAGNOSIS — F411 Generalized anxiety disorder: Secondary | ICD-10-CM | POA: Diagnosis present

## 2013-05-22 DIAGNOSIS — F1120 Opioid dependence, uncomplicated: Secondary | ICD-10-CM | POA: Diagnosis present

## 2013-05-22 DIAGNOSIS — F333 Major depressive disorder, recurrent, severe with psychotic symptoms: Secondary | ICD-10-CM | POA: Diagnosis present

## 2013-05-22 DIAGNOSIS — F329 Major depressive disorder, single episode, unspecified: Secondary | ICD-10-CM | POA: Diagnosis present

## 2013-05-22 DIAGNOSIS — F1020 Alcohol dependence, uncomplicated: Secondary | ICD-10-CM | POA: Diagnosis present

## 2013-05-22 DIAGNOSIS — F10939 Alcohol use, unspecified with withdrawal, unspecified: Principal | ICD-10-CM | POA: Diagnosis present

## 2013-05-22 DIAGNOSIS — Z79899 Other long term (current) drug therapy: Secondary | ICD-10-CM

## 2013-05-22 DIAGNOSIS — F1910 Other psychoactive substance abuse, uncomplicated: Secondary | ICD-10-CM

## 2013-05-22 DIAGNOSIS — R45851 Suicidal ideations: Secondary | ICD-10-CM

## 2013-05-22 DIAGNOSIS — F10239 Alcohol dependence with withdrawal, unspecified: Principal | ICD-10-CM | POA: Diagnosis present

## 2013-05-22 DIAGNOSIS — F1410 Cocaine abuse, uncomplicated: Secondary | ICD-10-CM | POA: Diagnosis present

## 2013-05-22 DIAGNOSIS — F1994 Other psychoactive substance use, unspecified with psychoactive substance-induced mood disorder: Secondary | ICD-10-CM

## 2013-05-22 MED ORDER — ALUM & MAG HYDROXIDE-SIMETH 200-200-20 MG/5ML PO SUSP: 30.0000 mL | ORAL | Status: DC | PRN

## 2013-05-22 MED ORDER — LORAZEPAM 1 MG PO TABS
0.0000 mg | ORAL_TABLET | Freq: Four times a day (QID) | ORAL | Status: DC
  Administered 2013-05-24: 1 mg via ORAL
  Filled 2013-05-22 (×3): qty 1

## 2013-05-22 MED ORDER — ONDANSETRON HCL 4 MG PO TABS: 4.0000 mg | ORAL_TABLET | Freq: Three times a day (TID) | ORAL | Status: DC | PRN

## 2013-05-22 MED ORDER — LORAZEPAM 1 MG PO TABS
1.0000 mg | ORAL_TABLET | Freq: Three times a day (TID) | ORAL | Status: DC | PRN
  Administered 2013-05-23: 1 mg via ORAL

## 2013-05-22 MED ORDER — LORAZEPAM 1 MG PO TABS
0.0000 mg | ORAL_TABLET | Freq: Two times a day (BID) | ORAL | Status: DC
  Administered 2013-05-23: 1 mg via ORAL

## 2013-05-22 MED ORDER — MAGNESIUM HYDROXIDE 400 MG/5ML PO SUSP: 30.0000 mL | Freq: Every day | ORAL | Status: DC | PRN

## 2013-05-22 MED ORDER — TRAZODONE HCL 50 MG PO TABS
50.0000 mg | ORAL_TABLET | Freq: Every evening | ORAL | Status: DC | PRN
Start: 2013-05-22 — End: 2013-05-24
  Administered 2013-05-23: 50 mg via ORAL
  Filled 2013-05-22: qty 1

## 2013-05-22 MED ORDER — QUETIAPINE FUMARATE 50 MG PO TABS
50.0000 mg | ORAL_TABLET | Freq: Every day | ORAL | Status: DC
  Administered 2013-05-22 – 2013-05-27 (×6): 50 mg via ORAL
  Filled 2013-05-22 (×13): qty 1

## 2013-05-22 MED ORDER — ACETAMINOPHEN 325 MG PO TABS
650.0000 mg | ORAL_TABLET | Freq: Four times a day (QID) | ORAL | Status: DC | PRN
Start: 2013-05-22 — End: 2013-06-01
  Administered 2013-05-26 – 2013-05-27 (×2): 650 mg via ORAL
  Filled 2013-05-22 (×2): qty 2

## 2013-05-22 NOTE — ED Notes (Signed)
Belongings sent with pt, [facility].

## 2013-05-22 NOTE — ED Notes (Signed)
Pt attended group in the activity room. Group topic was sleep hygiene and positive benefits to getting appropriate amounts of sleep and ways to improve their sleep hygiene. Pt stated one way he thing he learned from this group is that there is caffeine in cigarettes and it is best not to smoke one at least an hour before going to bed.
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
## 2013-05-22 NOTE — Progress Notes (Signed)
[facility] provided pt with a [employer] Orange Card application, highlighting [facility].  °

## 2013-05-22 NOTE — Consult Note (Signed)
Patient appears depressed, seems overwhelmed, needs inpatient admission along with substance abuse treatment

## 2013-05-22 NOTE — Consult Note (Signed)
[facility] Consult   Reason for Consult:  Anxiety, depression Substance dependence Referring Physician:  EDP Parker Berg is an 48 y.o. male.  Assessment: AXIS I:  Anxiety Disorder NOS, Depressive Disorder NOS, Substance Abuse and Substance Induced Mood Disorder AXIS II:  Deferred AXIS III:   Past Medical History  Diagnosis Date  . GERD (gastroesophageal reflux disease)   . Depression   . Anxiety    AXIS IV:  other psychosocial or environmental problems and problems related to social environment AXIS V:  41-50 serious symptoms  Plan:  Recommend psychiatric Inpatient admission when medically cleared.  Subjective:   Parker Berg is a 48 y.o. male patient admitted with DEPRESSIVE D/O, ANXIETY D/O, SUBSTANCE ABUSE.Marland Kitchen  HPI:  Parker Berg is a 48 y.o. male who voluntarily presents to [facility] with depression/SI/SA/anxiety/AH. Pt denies HI. Pt reports SI for several years with no hx of past attempts or inpt admissions.  He states his suicidal thoughts has increased for the last 3 months. Pt is SI w/plan to run in traffic. Pt has teary eyes during interview and made  poor eye contact, stating that he feels worthless, hopeless and helpless. Pt has the following stressors:(1) financial, (2) employment, (3) SA and patient reports excruciating face pain due to metal plates left on his face after surgery.  Patient states providers do not prescribe strong pain medications.  Patient states he has been addicted to substances for 30 years and have only been clean for 8 months at a time.  Patient also states he lost his daughter last year from drug OD and he has legal issues pending.  Patient states he does have thoughts of hurting himself at times and he also admitted to addiction to alcohol and Marijuana.  He reports poor sleep and poor appetite.  Patient also asked to be admitted at [facility] for rehabilitation stating the brief detoxification treatment in our [facility] will not be helpful to him.  We have  started patient on our Alcohol detox treatment using our Ativan protocol.  We will continue to monitor patient and refer him to [facility] or [facility] for rehabilitation.  Patient is vague with his answers when asked about suicide  but stated he does not know yet how he will hurt himself  He denies HI/AVH.  Based on his answer for suicidal thoughts, we will seek admission in our inpatient Psychiatric unit for treatment of his depression and anxiety. Patient have been accepted and admission orders will be written.   HPI Elements:   Location:  [facility]. Quality:  SEVERE. Severity:  SEVERE.  Past Psychiatric History: Past Medical History  Diagnosis Date  . GERD (gastroesophageal reflux disease)   . Depression   . Anxiety     reports that he has been smoking Cigarettes.  He has been smoking about 0.00 packs per day. He does not have any smokeless tobacco history on file. He reports that he drinks alcohol. He reports that he uses illicit drugs (Marijuana and "Crack" cocaine). No family history on file. Family History Substance Abuse: Yes, Describe: (2 daughter; brothers, various other family members ) Family Supports: Yes, List: (Sister ) Living Arrangements: Children (Lives with daughter ) Can pt return to current living arrangement?: Yes Abuse/Neglect [facility]) Physical Abuse: Denies Verbal Abuse: Denies Sexual Abuse: Denies Allergies:  No Known Allergies  ACT Assessment Complete:  Yes:    Educational Status    Risk to Self: Risk to self Suicidal Ideation: Yes-Currently Present Suicidal Intent: Yes-Currently Present Is patient at  risk for suicide?: Yes Suicidal Plan?: Yes-Currently Present Specify Current Suicidal Plan: Run in traffic  Access to Means: Yes Specify Access to Suicidal Means: Vehicles  What has been your use of drugs/alcohol within the last 12 months?: Abusing: alcohol, thc  Previous Attempts/Gestures: No How many times?: 0 Other Self Harm Risks: None  Triggers for Past Attempts:  None known Intentional Self Injurious Behavior: None Family Suicide History: Yes (Daughter died 6 mos from drug overdose ) Recent stressful life event(s): Loss (Comment);Legal Issues;Financial Problems (Several family members death in the last 5 yrs) Persecutory voices/beliefs?: No Depression: Yes Depression Symptoms: Insomnia;Tearfulness;Loss of interest in usual pleasures;Feeling worthless/self pity;Fatigue;Despondent Substance abuse history and/or treatment for substance abuse?: No Suicide prevention information given to non-admitted patients: Not applicable  Risk to Others: Risk to Others Homicidal Ideation: No Thoughts of Harm to Others: No Current Homicidal Intent: No Current Homicidal Plan: No Access to Homicidal Means: No Identified Victim: None  History of harm to others?: No Assessment of Violence: None Noted Violent Behavior Description: None  Does patient have access to weapons?: No Criminal Charges Pending?: Yes Describe Pending Criminal Charges: B&E Does patient have a court date: Yes Court Date: 06/10/13  Abuse: Abuse/Neglect Assessment (Assessment to be complete while patient is alone) Physical Abuse: Denies Verbal Abuse: Denies Sexual Abuse: Denies Exploitation of patient/patient's resources: Denies Self-Neglect: Denies  Prior Inpatient Therapy: Prior Inpatient Therapy Prior Inpatient Therapy: No Prior Therapy Dates: None  Prior Therapy Facilty/Provider(s): None  Reason for Treatment: None   Prior Outpatient Therapy: Prior Outpatient Therapy Prior Outpatient Therapy: No Prior Therapy Dates: None  Prior Therapy Facilty/Provider(s): None  Reason for Treatment: None   Additional Information: Additional Information 1:1 In Past 12 Months?: No CIRT Risk: No Elopement Risk: No Does patient have medical clearance?: Yes                  Objective: Blood pressure 118/79, pulse 77, temperature 97.7 F (36.5 C), temperature source Oral, resp. rate  16, SpO2 96.00%.There is no weight on file to calculate BMI. Results for orders placed during the hospital encounter of 05/21/13 (from the past 72 hour(s))  ACETAMINOPHEN LEVEL     Status: None   Collection Time    05/21/13  6:22 PM      Result Value Range   Acetaminophen (Tylenol), Serum <15.0  10 - 30 ug/mL   Comment:            THERAPEUTIC CONCENTRATIONS VARY     SIGNIFICANTLY. A RANGE OF 10-30     ug/mL MAY BE AN EFFECTIVE     CONCENTRATION FOR MANY PATIENTS.     HOWEVER, SOME ARE BEST TREATED     AT CONCENTRATIONS OUTSIDE THIS     RANGE.     ACETAMINOPHEN CONCENTRATIONS     >150 ug/mL AT 4 HOURS AFTER     INGESTION AND >50 ug/mL AT 12     HOURS AFTER INGESTION ARE     OFTEN ASSOCIATED WITH TOXIC     REACTIONS.  CBC     Status: None   Collection Time    05/21/13  6:22 PM      Result Value Range   WBC 9.1  4.0 - 10.5 K/uL   RBC 5.01  4.22 - 5.81 MIL/uL   Hemoglobin 15.6  13.0 - 17.0 g/dL   HCT 45.3  39.0 - 52.0 %   MCV 90.4  78.0 - 100.0 fL   MCH 31.1  26.0 - 34.0 pg  MCHC 34.4  30.0 - 36.0 g/dL   RDW 13.2  11.5 - 15.5 %   Platelets 289  150 - 400 K/uL  COMPREHENSIVE METABOLIC PANEL     Status: None   Collection Time    05/21/13  6:22 PM      Result Value Range   Sodium 138  137 - 147 mEq/L   Potassium 3.7  3.7 - 5.3 mEq/L   Chloride 100  96 - 112 mEq/L   CO2 25  19 - 32 mEq/L   Glucose, Bld 92  70 - 99 mg/dL   BUN 11  6 - 23 mg/dL   Creatinine, Ser 0.93  0.50 - 1.35 mg/dL   Calcium 9.8  8.4 - 10.5 mg/dL   Total Protein 8.0  6.0 - 8.3 g/dL   Albumin 4.0  3.5 - 5.2 g/dL   AST 19  0 - 37 U/L   ALT 17  0 - 53 U/L   Alkaline Phosphatase 98  39 - 117 U/L   Total Bilirubin 0.5  0.3 - 1.2 mg/dL   GFR calc non Af Amer >90  >90 mL/min   GFR calc Af Amer >90  >90 mL/min   Comment: (NOTE)     The eGFR has been calculated using the CKD EPI equation.     This calculation has not been validated in all clinical situations.     eGFR's persistently <90 mL/min signify  possible Chronic Kidney     Disease.  ETHANOL     Status: None   Collection Time    05/21/13  6:22 PM      Result Value Range   Alcohol, Ethyl (B) <11  0 - 11 mg/dL   Comment:            LOWEST DETECTABLE LIMIT FOR     SERUM ALCOHOL IS 11 mg/dL     FOR MEDICAL PURPOSES ONLY  SALICYLATE LEVEL     Status: Abnormal   Collection Time    05/21/13  6:22 PM      Result Value Range   Salicylate Lvl <1.1 (*) 2.8 - 20.0 mg/dL  URINE RAPID DRUG SCREEN (HOSP PERFORMED)     Status: Abnormal   Collection Time    05/21/13  6:52 PM      Result Value Range   Opiates NONE DETECTED  NONE DETECTED   Cocaine NONE DETECTED  NONE DETECTED   Benzodiazepines NONE DETECTED  NONE DETECTED   Amphetamines NONE DETECTED  NONE DETECTED   Tetrahydrocannabinol POSITIVE (*) NONE DETECTED   Barbiturates NONE DETECTED  NONE DETECTED   Comment:            DRUG SCREEN FOR MEDICAL PURPOSES     ONLY.  IF CONFIRMATION IS NEEDED     FOR ANY PURPOSE, NOTIFY LAB     WITHIN 5 DAYS.                LOWEST DETECTABLE LIMITS     FOR URINE DRUG SCREEN     Drug Class       Cutoff (ng/mL)     Amphetamine      1000     Barbiturate      200     Benzodiazepine   031     Tricyclics       594     Opiates          300     Cocaine  300     THC              50   Labs are reviewed and are pertinent for Unremarkable lab results , UDS is positive for Marijuana.  Current Facility-Administered Medications  Medication Dose Route Frequency Provider Last Rate Last Dose  . LORazepam (ATIVAN) tablet 0-4 mg  0-4 mg Oral Q6H Marissa Sciacca, PA-C   1 mg at 05/21/13 2040   Followed by  . [START ON 05/23/2013] LORazepam (ATIVAN) tablet 0-4 mg  0-4 mg Oral Q12H Marissa Sciacca, PA-C      . LORazepam (ATIVAN) tablet 1 mg  1 mg Oral Q8H PRN Marissa Sciacca, PA-C   1 mg at 05/21/13 2239  . ondansetron (ZOFRAN) tablet 4 mg  4 mg Oral Q8H PRN Marissa Sciacca, PA-C       No current outpatient prescriptions on file.    Psychiatric  Specialty Exam:     Blood pressure 118/79, pulse 77, temperature 97.7 F (36.5 C), temperature source Oral, resp. rate 16, SpO2 96.00%.There is no weight on file to calculate BMI.  General Appearance: Disheveled  Eye Contact::  Poor  Speech:  Clear and Coherent and Normal Rate  Volume:  Normal  Mood:  Angry, Anxious, Depressed, Hopeless and Worthless  Affect:  Congruent, Depressed and Flat  Thought Process:  Coherent and Goal Directed  Orientation:  Full (Time, Place, and Person)  Thought Content:  NA  Suicidal Thoughts:  No  Homicidal Thoughts:  No  Memory:  Immediate;   Good Recent;   Good Remote;   Good  Judgement:  Poor  Insight:  Shallow  Psychomotor Activity:  Normal  Concentration:  Good  Recall:  NA  Akathisia:  NA  Handed:  Right  AIMS (if indicated):     Assets:  Desire for Improvement Financial Resources/Insurance Social Support  Sleep:      Treatment Plan Summary:  Consult and face to face interview with Dr Dwyane Dee We have already started patient on our Ativan detox protocol We will monitor daily for effectiveness and decrease in withdrawal symptoms We have accepted patient to our 500 hall unit for treatment of his depression and anxiety. Daily contact with patient to assess and evaluate symptoms and progress in treatment Medication management  Delfin Gant   PMHNP-BC 05/22/2013 2:51 PM

## 2013-05-22 NOTE — Progress Notes (Signed)
Adult Psychoeducational Group Note  Date:  05/22/2013 Time:  10:31 PM  Group Topic/Focus:  Wrap-Up Group:   The focus of this group is to help patients review their daily goal of treatment and discuss progress on daily workbooks.  Participation Level:  Active  Participation Quality:  Appropriate and Attentive  Affect:  Appropriate  Cognitive:  Alert and Appropriate  Insight: Appropriate  Engagement in Group:  Engaged  Modes of Intervention:  Discussion, Education, Socialization and Support  Additional Comments:  Pt was active during this group. Pt was just admitted, but he shared why he was here. Pt said he has had a lot of deaths in his family and has thought about killing himself. Pt shared that he has struggled with addiction for 20 years, but is looking forward to getting the help he needs and being clean.  Malachy MoanJeffers, Asli Tokarski S 05/22/2013, 10:31 PM

## 2013-05-22 NOTE — Tx Team (Signed)
Initial Interdisciplinary Treatment Plan  PATIENT STRENGTHS: (choose at least two) Average or above average intelligence General fund of knowledge Physical Health Supportive family/friends  PATIENT STRESSORS: Financial difficulties Legal issue Substance abuse   PROBLEM LIST: Problem List/Patient Goals Date to be addressed Date deferred Reason deferred Estimated date of resolution  Substance abuse 05/22/13     Suicidal ideation 05/22/13     Depression 05/22/13                                          DISCHARGE CRITERIA:  Improved stabilization in mood, thinking, and/or behavior Verbal commitment to aftercare and medication compliance Withdrawal symptoms are absent or subacute and managed without 24-hour nursing intervention  PRELIMINARY DISCHARGE PLAN: Attend 12-step recovery group Outpatient therapy  PATIENT/FAMIILY INVOLVEMENT: This treatment plan has been presented to and reviewed with the patient, Vinetta BergamoMarcus Rabe, and/or family member.  The patient and family have been given the opportunity to ask questions and make suggestions.  Leda QuailSmith, Caoilainn Sacks T 05/22/2013, 11:06 PM

## 2013-05-22 NOTE — BH Assessment (Signed)
[facility] Dahlia ByesJosephine Onuoha, NP. Attending MD Jonnalagadda, bed assigned [phone].

## 2013-05-23 ENCOUNTER — Encounter (HOSPITAL_COMMUNITY): Payer: Self-pay | Admitting: Psychiatry

## 2013-05-23 DIAGNOSIS — F10239 Alcohol dependence with withdrawal, unspecified: Principal | ICD-10-CM | POA: Diagnosis present

## 2013-05-23 DIAGNOSIS — F1020 Alcohol dependence, uncomplicated: Secondary | ICD-10-CM | POA: Diagnosis present

## 2013-05-23 DIAGNOSIS — F1120 Opioid dependence, uncomplicated: Secondary | ICD-10-CM | POA: Diagnosis present

## 2013-05-23 DIAGNOSIS — F411 Generalized anxiety disorder: Secondary | ICD-10-CM | POA: Diagnosis present

## 2013-05-23 DIAGNOSIS — F333 Major depressive disorder, recurrent, severe with psychotic symptoms: Secondary | ICD-10-CM | POA: Diagnosis present

## 2013-05-23 DIAGNOSIS — F1994 Other psychoactive substance use, unspecified with psychoactive substance-induced mood disorder: Secondary | ICD-10-CM

## 2013-05-23 DIAGNOSIS — F1410 Cocaine abuse, uncomplicated: Secondary | ICD-10-CM | POA: Diagnosis present

## 2013-05-23 DIAGNOSIS — F10939 Alcohol use, unspecified with withdrawal, unspecified: Principal | ICD-10-CM | POA: Diagnosis present

## 2013-05-23 MED ORDER — SERTRALINE HCL 25 MG PO TABS
25.0000 mg | ORAL_TABLET | Freq: Every day | ORAL | Status: DC
  Administered 2013-05-23 – 2013-05-31 (×9): 25 mg via ORAL
  Filled 2013-05-23 (×11): qty 1

## 2013-05-23 MED ORDER — QUETIAPINE FUMARATE 25 MG PO TABS
25.0000 mg | ORAL_TABLET | Freq: Three times a day (TID) | ORAL | Status: DC
  Administered 2013-05-23 – 2013-05-31 (×24): 25 mg via ORAL
  Filled 2013-05-23 (×30): qty 1
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
## 2013-05-23 NOTE — [facility] Group Notes (Signed)
[facility] Group Notes:  (Clinical Social Work)  05/23/2013   3:00-4:00PM  Summary of Progress/Problems:   The main focus of today's process group was for the patient to identify ways in which they have sabotaged their own mental health wellness/recovery.  Motivational interviewing and a handout were used to explore the benefits and costs of their self-sabotaging behavior as well as the benefits and costs of changing this behavior.  The Stages of Change were explained to the group using a handout, and patients identified where they are with regard to changing self-defeating behaviors.  The patient expressed he self-sabotages with people pleasing,drugs and alcohol, and mostly arrogance.  He was very insightful when he did speak, but he was in and out of the room numerous times and was not focused, was disruptive in the constant movement.  Type of Therapy:  Process Group  Participation Level:  Active  Participation Quality:  Attentive, Inattentive and constantly in and out  Affect:  Appropriate  Cognitive:  Oriented  Insight:  Engaged  Engagement in Therapy:  Limited  Modes of Intervention:  Education, Motivational Interviewing   Ambrose MantleMareida Grossman-Orr, LCSW 05/23/2013, 4:00pm

## 2013-05-23 NOTE — Progress Notes (Signed)
[facility] Group Notes:  (Nursing/MHT/Case Management/Adjunct)  Date:  05/23/2013  Time:  10:00 PM  Type of Therapy:  Group Therapy  Participation Level:  Active  Participation Quality:  Appropriate  Affect:  Appropriate  Cognitive:  Appropriate  Insight:  Appropriate  Engagement in Group:  Engaged  Modes of Intervention:  Discussion  Summary of Progress/Problems:The pt expressed that his day was great.The pt said he has been  Three days alcohol free and he was thankful for staff.  [name], Charli Halle Lee 05/23/2013, 10:00 PM

## 2013-05-23 NOTE — Progress Notes (Signed)
Psychoeducational Group Note  Date: 05/23/2013 Time: 0930  Group Topic/Focus:  Identifying Needs:   The focus of this group is to help patients identify their personal needs that have been historically problematic and identify healthy behaviors to address their needs.  Participation Level:  Active  Participation Quality:  Appropriate  Affect:  Angry and Appropriate  Cognitive:  Appropriate  Insight:  Engaged  Engagement in Group:  Engaged  Additional Comments:    1/17/20152:09 PM Jarvis Sawa, Joie BimlerPatricia Lynn

## 2013-05-23 NOTE — Progress Notes (Signed)
Psychoeducational Group Note  Date:  12/15/2011 Time:  1030 Group Topic/Focus:   Identifying Needs:  Good   Participation Level:  Active  Participation Quality: good  Affect: flat  Cognitive:fair    Insight:  good  Engagement in Group: engaged  Additional Comments:
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
## 2013-05-24 DIAGNOSIS — F10939 Alcohol use, unspecified with withdrawal, unspecified: Principal | ICD-10-CM

## 2013-05-24 DIAGNOSIS — F1410 Cocaine abuse, uncomplicated: Secondary | ICD-10-CM

## 2013-05-24 DIAGNOSIS — F1020 Alcohol dependence, uncomplicated: Secondary | ICD-10-CM

## 2013-05-24 DIAGNOSIS — F10239 Alcohol dependence with withdrawal, unspecified: Principal | ICD-10-CM

## 2013-05-24 DIAGNOSIS — F1120 Opioid dependence, uncomplicated: Secondary | ICD-10-CM

## 2013-05-24 DIAGNOSIS — F411 Generalized anxiety disorder: Secondary | ICD-10-CM

## 2013-05-24 MED ORDER — TRAZODONE HCL 100 MG PO TABS
100.0000 mg | ORAL_TABLET | Freq: Every evening | ORAL | Status: DC | PRN
  Administered 2013-05-24: 100 mg via ORAL
  Filled 2013-05-24: qty 1
  Filled 2013-05-24: qty 28

## 2013-05-24 NOTE — Progress Notes (Signed)
Parker Berg has attended his groups and taken his meds as they are scheduled, tolerated well today.  He has more insight than he's willing to admit, and is trying to learn healtheir coping skills, admitting that he " [place]   A H denies SI within the past 24 hrs and he does not rate his depression and hopelessness.   R Safety is in place and poc maintaiend.

## 2013-05-24 NOTE — Progress Notes (Signed)
[facility] Group Notes:  (Nursing/MHT/Case Management/Adjunct)  Date:  05/24/2013  Time:  11:39 PM  Type of Therapy:  Group Therapy  Participation Level:  Active  Participation Quality:  Appropriate  Affect:  Appropriate  Cognitive:  Appropriate  Insight:  Appropriate  Engagement in Group:  Engaged  Modes of Intervention:  Discussion  Summary of Progress/Problems:The patient expressed that in group today he a lot of his issues came from his childhood past.The patient said he needs to focus on the positive thing in his life.   [name], [name] 05/24/2013, 11:39 PM

## 2013-05-24 NOTE — Progress Notes (Signed)
[facility] MD Progress Note  05/24/2013 12:56 PM Parker Berg  MRN:  [id number] Subjective:  Patient stated his sleep was not good--Trazodone increased.  Appetite is "fair", depression continues with suicidal ideations.  Ativan protocol discontinued due to lack of need and withdrawal symptoms along with his lying in bed much of the time. 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 GERD (gastroesophageal reflux disease)   . Depression   . Anxiety    Axis IV: economic problems, housing problems, other psychosocial or environmental problems, problems related to legal system/crime, problems related to social environment and problems with primary support group Axis V: 41-50 serious symptoms  ADL's:  Intact  Sleep: Poor  Appetite:  Fair  Suicidal Ideation:  Plan:  vague Intent:  none Means:  none Homicidal Ideation:  Denies   Psychiatric Specialty Exam: Review of Systems  Psychiatric/Behavioral: Positive for depression and substance abuse.    Blood pressure 132/78, pulse 85, temperature 98 F (36.7 C), temperature source Oral, resp. rate 16, height 5\' 7"  (1.702 m), weight 68.493 kg (151 lb).Body mass index is 23.64 kg/(m^2).  General Appearance: Disheveled  Eye Contact::  Minimal  Speech:  Slow  Volume:  Decreased  Mood:  Depressed  Affect:  Congruent  Thought Process:  Coherent  Orientation:  Full (Time, Place, and Person)  Thought Content:  WDL  Suicidal Thoughts:  Yes.  without intent/plan  Homicidal Thoughts:  No  Memory:  Immediate;   Fair Recent;   Fair Remote;   Fair  Judgement:  Poor  Insight:  Lacking  Psychomotor Activity:  Decreased  Concentration:  Fair  Recall:  Poor   Akathisia:  No  Handed:  Right  AIMS (if indicated):     Assets:  Physical Health  Sleep:  Number of Hours: 6.25   Current Medications: Current Facility-Administered Medications  Medication Dose Route Frequency Provider Last Rate Last Dose  . acetaminophen (TYLENOL) tablet 650 mg  650 mg Oral Q6H PRN Earney Navy, NP      . alum & mag hydroxide-simeth (MAALOX/MYLANTA) 200-200-20 MG/5ML suspension 30 mL  30 mL Oral Q4H PRN Earney Navy, NP      . magnesium hydroxide (MILK OF MAGNESIA) suspension 30 mL  30 mL Oral Daily PRN Earney Navy, NP      . ondansetron (ZOFRAN) tablet 4 mg  4 mg Oral Q8H PRN Earney Navy, NP      . QUEtiapine (SEROQUEL) tablet 25 mg  25 mg Oral TID Nanine Means, NP   25 mg at 05/23/13 1904  . QUEtiapine (SEROQUEL) tablet 50 mg  50 mg Oral QHS Earney Navy, NP   50 mg at 05/23/13 2118  . sertraline (ZOLOFT) tablet 25 mg  25 mg Oral Daily Nanine Means, NP   25 mg at 05/23/13 1904  . traZODone (DESYREL) tablet 50 mg  50 mg Oral QHS PRN,MR X 1 Earney Navy, NP   50 mg at 05/23/13 2118    Lab Results: No results found for this or any previous visit (from the past 48 hour(s)).  Physical Findings: AIMS: Facial and Oral Movements Muscles of Facial Expression: None, normal Lips and Perioral Area: None, normal Jaw: None, normal Tongue: None,  normal,Extremity Movements Upper (arms, wrists, hands, fingers): None, normal Lower (legs, knees, ankles, toes): None, normal, Trunk Movements Neck, shoulders, hips: None, normal, Overall Severity Severity of abnormal movements (highest score from questions above): None, normal Incapacitation due to abnormal movements: None, normal Patient's awareness of abnormal movements (rate only patient's report): No Awareness, Dental Status Current problems with teeth and/or dentures?: No Does patient usually wear dentures?: No  CIWA:  CIWA-Ar Total: 0 COWS:  COWS Total Score: 0  Treatment Plan  Summary: Daily contact with patient to assess and evaluate symptoms and progress in treatment Medication management  Plan:  Review of chart, vital signs, medications, and notes. 1-Individual and group therapy 2-Medication management for depression and anxiety:  Medications reviewed with the patient and Ativan protocol discontinued and Trazodone increased from 50 to 100 mg for sleep issues 3-Coping skills for depression, anxiety, and substance use 4-Continue crisis stabilization and management 5-Address health issues--monitoring vital signs, stable 6-Treatment plan in progress to prevent relapse of depression, alcohol use, and anxiety  Medical Decision Making Problem Points:  Established problem, stable/improving (1) and Review of psycho-social stressors (1) Data Points:  Review of medication regiment & side effects (2)  I certify that inpatient services furnished can reasonably be expected to improve the patient's condition.   Nanine MeansLORD, JAMISON, PMH-NP 05/24/2013, 12:56 PM I agreed with findings and treatment plan of this patient

## 2013-05-24 NOTE — Progress Notes (Signed)
Patient has been up in the dayroom laughing and talking with peers. He attended group this evening and participated. He is compliant with his medications and reports that he slept last night but was not able to remain asleep without waking up. Writer informed him of sleep medication available if needed and he requested to take it. Patient reports passive si and verbally contracts, he denies hi/a/v hallucinations. Safety maintained on unit with 15 min checks.

## 2013-05-24 NOTE — Progress Notes (Signed)
[facility] Group Notes:  (Nursing/MHT/Case Management/Adjunct)  Date:  05/24/2013  Time:  5:01 PM  Type of Therapy:  Psychoeducational Skills  Participation Level:  Appropriate  Participation Quality:  Appropriate and Attentive  Affect:  Appropriate  Cognitive:  Appropriate  Insight:  Appropriate  Engagement in Group:  Engaged  Modes of Intervention:  Activity  Summary of Progress/Problems: Pts did an activity where they wrote down 5 unique qualities about themselves and had to guess who those qualities belonged to. Pts were able to share things they wouldn't normally have shared and learn about their peers.   Caswell CorwinOwen, Joene Gelder C 05/24/2013, 5:01 PM

## 2013-05-24 NOTE — Progress Notes (Signed)
Psychoeducational Group Note  Date:  05/24/2013 Time:  1015  Group Topic/Focus:  Making Healthy Choices:   The focus of this group is to help patients identify negative/unhealthy choices they were using prior to admission and identify positive/healthier coping strategies to replace them upon discharge.  Participation Level:  Active  Participation Quality:  Appropriate  Affect:  Appropriate  Cognitive:  Oriented  Insight:  Engaged  Engagement in Group:  Improving  Additional Comments:    Thurston Brendlinger A 05/24/2013  

## 2013-05-24 NOTE — [facility] Group Notes (Signed)
[facility] Group Notes:  (Clinical Social Work)  05/24/2013   1:15-2:15PM  Summary of Progress/Problems:  The main focus of today's process group was to   identify the patient's current support system and decide on other supports that can be put in place.  The picture on workbook was used to discuss why additional supports are needed.  An emphasis was placed on using counselor, doctor, therapy groups, 12-step groups, and problem-specific support groups to expand supports.   The patient expressed full comprehension of the concepts presented.  He listened attentively, did not contribute much today.  He stated he has the support currently of his sister, nieces, and nephews, and has to think about what he can add.  Type of Therapy:  Process Group  Participation Level:  Active  Participation Quality:  Attentive and Sharing  Affect:  Blunted  Cognitive:  Appropriate and Oriented  Insight:  Developing/Improving  Engagement in Therapy:  Engaged  Modes of Intervention:  Education,  Support and ConAg[employer] FoodsProcessing  Michaelia Beilfuss Grossman-Orr, LCSW 05/24/2013, 4:00pm

## 2013-05-24 NOTE — Progress Notes (Signed)
Psychoeducational Group Note  Date: 05/24/2013 Time:  0930  Group Topic/Focus:  Gratefulness:  The focus of this group is to help patients identify what two things they are most grateful for in their lives. What helps ground them and to center them on their work to their recovery.  Participation Level:  Active  Participation Quality:  Appropriate  Affect:  Appropriate  Cognitive:  Oriented  Insight:  Improving  Engagement in Group:  Engaged  Additional Comments:    Yoana Staib A  

## 2013-05-25 DIAGNOSIS — F329 Major depressive disorder, single episode, unspecified: Secondary | ICD-10-CM

## 2013-05-25 NOTE — Progress Notes (Signed)
Recreation Therapy Notes  Date: 01.19.2015 Time: 3:00pm Location: [address] Dayroom   Group Topic: Coping Skills  Goal Area(s) Addresses:  Patient will identify benefit of using coping skill.  Patient will identify ability to positively change life by using coping skills.   Behavioral Response: Engaged, Appropriate  Intervention: Journaling  Activity: 40-20-10-5. Patients were asked to identify their reason for admission, using this as inspiration patients were asked to write about it in 40, 20, 10, 5, and 1 word(s).    Education: PharmacologistCoping Skills, Discharge Planning   Education Outcome: Acknowledges understanding   Clinical Observations/Feedback: Patient actively enaged in group activity. Patient made no contributions to group discussion, but appeared to actively listen as he maintained appropriate eye contact with speaker.   Marykay Lexenise L Saman Giddens, LRT/CTRS  Jearl KlinefelterBlanchfield, Rayna Brenner L 05/25/2013 4:59 PM

## 2013-05-25 NOTE — Progress Notes (Signed)
Patient has been observed by writer up in the dayroom watching tv, playing cards and interacting appropriately with peers. He reports that he had a better day and he slept well last night. He reports passive si and verbally contracts, denies hi and reports that the voices are still there but not as bad. Patient reports that he is hopeful to go to a long term treatment facility once discharged to help with his alcohol addiction. Safety maintained on unit with 15 min checks, will continue to monitor.
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
## 2013-05-25 NOTE — ED Provider Notes (Signed)
Medical screening examination/treatment/procedure(s) were performed by non-physician practitioner and as supervising physician I was immediately available for consultation/collaboration.   Zosia Lucchese T Strother Everitt, MD 05/25/13 0720 

## 2013-05-25 NOTE — Progress Notes (Signed)
Adult Psychoeducational Group Note  Date:  05/25/2013 Time:  10:18 PM  Group Topic/Focus:  Wrap-Up Group:   The focus of this group is to help patients review their daily goal of treatment and discuss progress on daily workbooks.  Participation Level:  Active  Participation Quality:  Appropriate  Affect:  Appropriate  Cognitive:  Appropriate  Insight: Appropriate  Engagement in Group:  Engaged  Modes of Intervention:  Support  Additional Comments:  Pt stated that one positive thing was that he was able to speak to the doctor and was able to get into a treatment program. Pt was given encouragement for his future plans  Alyviah Crandle 05/25/2013, 10:18 PM

## 2013-05-25 NOTE — Progress Notes (Signed)
Patient ID: [name], male   DOB: 05/06/1966, 48 y.o.   MRN: [id number] D- Patient reports fair sleep and improving appetite.  His energy level is low and his ability to pay attention is improving.  His depression is 8/10 and hopelessness is 7/10.  He reports thoughts of self harm off and on and he does contract for safety.  A- Supported patient.  R- Patient hopes to go to a treatment center to get help with his depression and his addiction.  He is homeless and feels "I really need help."

## 2013-05-25 NOTE — Tx Team (Signed)
Interdisciplinary Treatment Plan Update (Adult)  Date: 05/25/2013  Time Reviewed:  9:45 AM  Progress in Treatment: Attending groups: Yes Participating in groups:  Yes Taking medication as prescribed:  Yes Tolerating medication:  Yes Family/Significant othe contact made: CSW assessing  Patient understands diagnosis:  Yes Discussing patient identified problems/goals with staff:  Yes Medical problems stabilized or resolved:  Yes Denies suicidal/homicidal ideation: Yes Issues/concerns per patient self-inventory:  Yes Other:  New problem(s) identified: N/A  Discharge Plan or Barriers: CSW assessing for appropriate referrals.  Reason for Continuation of Hospitalization: Anxiety Depression Medication Stabilization  Comments: N/A  Estimated length of stay: 3-5 days  For review of initial/current patient goals, please see plan of care.  Attendees: Patient:     Family:     Physician:  Dr. Johnalagadda 05/25/2013 10:07 AM   Nursing:   Christa Dopson, RN 05/25/2013 10:07 AM   Clinical Social Worker:  Brelynn Wheller Horton, LCSW 05/25/2013 10:07 AM   Other: Conrad Withrow, PA 05/25/2013 10:07 AM   Other:  Valerie Noch, care coordination 05/25/2013 10:07 AM   Other:  Beverly Knight, RN 05/25/2013 10:07 AM   Other:  Carol Davis, RN 05/25/2013 10:07 AM   Other:    Other:    Other:    Other:    Other:    Other:     Scribe for Treatment Team:   Horton, Zyquan Crotty Nicole, 05/25/2013 10:07 AM    

## 2013-05-25 NOTE — Progress Notes (Signed)
Patient ID: Parker Berg, male   DOB: 08/17/1965, 48 y.o.   MR[facility]: [id number] [facility] Progress Note  05/25/2013 3:09 PM Parker Berg  MRN:  [id number]  Subjective:  Patient has been suffering with major depressive disorder, anxiety disorder and alcohol dependence, cocaine abuse and also reportedly eating pain medication whenever he can get onto his hands. Patient continue to  how depression with suicidal ideations contract for safety while in hospital. Patient Has Been Isolated, Withdrawn as decreased psychomotor activity and appeared to be lying in his bed most of the time.  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 GERD (gastroesophageal reflux disease)   . Depression   . Anxiety    Axis IV: economic problems, housing problems, other psychosocial or environmental problems, problems related to legal system/crime, problems related to social environment and problems with primary support group Axis V: 41-50 serious symptoms  ADL's:  Intact  Sleep: Poor  Appetite:  Fair  Suicidal Ideation:  Plan:  vague Intent:  none Means:  none Homicidal Ideation:  Denies   Psychiatric Specialty Exam: Review of Systems  Psychiatric/Behavioral: Positive for depression and substance abuse.    Blood pressure 112/68, pulse 82, temperature 97.2 F (36.2 C), temperature source Oral, resp. rate 18, height 5\' 7"  (1.702 m), weight 68.493 kg (151 lb).Body mass index is 23.64 kg/(m^2).  General Appearance: Disheveled  Eye Contact::  Minimal  Speech:  Slow  Volume:  Decreased  Mood:  Depressed  Affect:  Congruent  Thought Process:  Coherent  Orientation:  Full (Time, Place, and Person)  Thought Content:  WDL   Suicidal Thoughts:  Yes.  without intent/plan  Homicidal Thoughts:  No  Memory:  Immediate;   Fair Recent;   Fair Remote;   Fair  Judgement:  Poor  Insight:  Lacking  Psychomotor Activity:  Decreased  Concentration:  Fair  Recall:  Poor  Akathisia:  No  Handed:  Right  AIMS (if indicated):     Assets:  Physical Health  Sleep:  Number of Hours: 6   Current Medications: Current Facility-Administered Medications  Medication Dose Route Frequency Provider Last Rate Last Dose  . acetaminophen (TYLENOL) tablet 650 mg  650 mg Oral Q6H PRN Earney NavyJosephine C Onuoha, NP      . alum & mag hydroxide-simeth (MAALOX/MYLANTA) 200-200-20 MG/5ML suspension 30 mL  30 mL Oral Q4H PRN Earney NavyJosephine C Onuoha, NP      . magnesium hydroxide (MILK OF MAGNESIA) suspension 30 mL  30 mL Oral Daily PRN Earney NavyJosephine C Onuoha, NP      . ondansetron (ZOFRAN) tablet 4 mg  4 mg Oral Q8H PRN Earney NavyJosephine C Onuoha, NP      . QUEtiapine (SEROQUEL) tablet 25 mg  25 mg Oral TID Nanine MeansJamison Lord, NP   25 mg at 05/25/13 1211  . QUEtiapine (SEROQUEL) tablet 50 mg  50 mg Oral QHS Earney NavyJosephine C Onuoha, NP   50 mg at 05/24/13 2140  . sertraline (ZOLOFT) tablet 25 mg  25 mg Oral Daily Nanine MeansJamison Lord, NP   25 mg at 05/25/13 0919  . traZODone (DESYREL) tablet 100 mg  100 mg Oral QHS PRN,MR X 1 Nanine MeansJamison Lord, NP   100 mg at 05/24/13 2140  Lab Results: No results found for this or any previous visit (from the past 48 hour(s)).  Physical Findings: AIMS: Facial and Oral Movements Muscles of Facial Expression: None, normal Lips and Perioral Area: None, normal Jaw: None, normal Tongue: None, normal,Extremity Movements Upper (arms, wrists, hands, fingers): None, normal Lower (legs, knees, ankles, toes): None, normal, Trunk Movements Neck, shoulders, hips: None, normal, Overall Severity Severity of abnormal movements (highest score from questions above): None, normal Incapacitation due to abnormal movements: None, normal Patient's awareness of  abnormal movements (rate only patient's report): No Awareness, Dental Status Current problems with teeth and/or dentures?: No Does patient usually wear dentures?: No  CIWA:  CIWA-Ar Total: 0 COWS:  COWS Total Score: 0  Treatment Plan Summary: Daily contact with patient to assess and evaluate symptoms and progress in treatment Medication management  Plan:  Review of chart, vital signs, medications, and notes. 1-Individual and group therapy 2-Medication management for depression and anxiety:  Medications reviewed with the patient and  continue Seroquel 25 mg 3 times a day Zoloft 25 mg daily and Seroquel 50 mg at bedtime   continue Trazodone 100 mg for sleep issues 3-Coping skills for depression, anxiety, and substance use 4-Continue crisis stabilization and management 5-Address health issues--monitoring vital signs, stable 6-Treatment plan in progress to prevent relapse of depression, alcohol use, and anxiety  Medical Decision Making Problem Points:  Established problem, stable/improving (1) and Review of psycho-social stressors (1) Data Points:  Review of medication regiment & side effects (2)  I certify that inpatient services furnished can reasonably be expected to improve the patient's condition.   Parker Berg,Parker R.  05/25/2013, 3:09 PM

## 2013-05-25 NOTE — [facility] Group Notes (Signed)
[facility] LCSW Aftercare Discharge Planning Group Note   05/25/2013  8:45 AM  Participation Quality:  Did Not Attend - pt sleeping in their room  Senovia Gauer Horton, LCSW 05/25/2013 9:44 AM        

## 2013-05-25 NOTE — [facility] Group Notes (Signed)
[facility] LCSW Group Therapy  05/25/2013 1:15 PM   Type of Therapy:  Group Therapy  Participation Level:  Did Not Attend - pt sleeping in his room   Parker IvanChelsea Horton, LCSW 05/25/2013 3:23 PM

## 2013-05-26 NOTE — Progress Notes (Signed)
Pt reports he is "doing about the same" this evening.  He states he wants to get into a long term treatment program, preferably a residential program.  [facility].  He does not want to return to his daughter's house, because she and her husband are addicts.  Pt says he still has self harm thoughts that come and go, but he can contract for safety.  He denies HI/AV at this time.  He denies any withdrawal symptoms.  Pt has been in the dayroom watching TV and talking with his peers.  Pt makes his needs known to staff.  Support and encouragement offered.  Safety maintained with q15 minute checks.

## 2013-05-26 NOTE — Progress Notes (Signed)
Patient ID: Parker Berg, male   DOB: 03/02/1966, 48 y.o.   MRN: [id number] [facility]  05/26/2013 2:15 PM Parker Berg  MRN:  [id number]  Subjective:  Patient has been suffering with major depressive disorder, anxiety disorder and alcohol dependence, cocaine abuse and also reportedly eating pain medication whenever he can get onto his hands. Patient minimizes depression and anxiety, denies SI, HI, and AVH, and contracts for safety while in hospital. Patient Has Been Isolated, Withdrawn as decreased psychomotor activity, but is more active and social as of today. Denies any other physical or psychological complaints at this time.   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 GERD (gastroesophageal reflux disease)   . Depression   . Anxiety    Axis IV: economic problems, housing problems, other psychosocial or environmental problems, problems related to legal system/crime, problems related to social environment and problems with primary support group Axis V: 41-50 serious symptoms  ADL's:  Intact  Sleep: Poor  Appetite:  Good  Suicidal Ideation:  Denies Homicidal Ideation:  Denies   Psychiatric Specialty Exam: Review of Systems  Psychiatric/Behavioral: Positive for depression and substance abuse.    Blood pressure 129/93, pulse 70, temperature 97.6 F (36.4 C), temperature source Oral, resp. rate 16, height 5\' 7"  (1.702 m), weight 68.493 kg (151 lb).Body mass index is 23.64 kg/(m^2).  General Appearance: Disheveled  Eye Contact::  Minimal  Speech:  Slow  Volume:  Decreased  Mood:  Depressed  Affect:  Congruent  Thought Process:  Coherent  Orientation:  Full (Time, Place,  and Person)  Thought Content:  WDL  Suicidal Thoughts:  No  Homicidal Thoughts:  No  Memory:  Immediate;   Fair Recent;   Fair Remote;   Fair  Judgement:  Poor  Insight:  Lacking  Psychomotor Activity:  Decreased  Concentration:  Fair  Recall:  Poor  Akathisia:  No  Handed:  Right  AIMS (if indicated):     Assets:  Physical Health  Sleep:  Number of Hours: 6.75   Current Medications: Current Facility-Administered Medications  Medication Dose Route Frequency Provider Last Rate Last Dose  . acetaminophen (TYLENOL) tablet 650 mg  650 mg Oral Q6H PRN Earney NavyJosephine C Onuoha, NP   650 mg at 05/26/13 1102  . alum & mag hydroxide-simeth (MAALOX/MYLANTA) 200-200-20 MG/5ML suspension 30 mL  30 mL Oral Q4H PRN Earney NavyJosephine C Onuoha, NP      . magnesium hydroxide (MILK OF MAGNESIA) suspension 30 mL  30 mL Oral Daily PRN Earney NavyJosephine C Onuoha, NP      . ondansetron (ZOFRAN) tablet 4 mg  4 mg Oral Q8H PRN Earney NavyJosephine C Onuoha, NP      . QUEtiapine (SEROQUEL) tablet 25 mg  25 mg Oral TID Nanine MeansJamison Lord, NP   25 mg at 05/26/13 1208  . QUEtiapine (SEROQUEL) tablet 50 mg  50 mg Oral QHS Earney NavyJosephine C Onuoha, NP   50 mg at 05/25/13 2207  . sertraline (ZOLOFT) tablet 25 mg  25 mg Oral Daily Nanine MeansJamison Lord, NP   25 mg at 05/26/13 0840  . traZODone (DESYREL) tablet 100 mg  100 mg Oral QHS PRN,MR X 1 Nanine MeansJamison Lord, NP   100 mg at 05/24/13 2140  Lab Results: No results found for this or any previous visit (from the past 48 hour(s)).  Physical Findings: AIMS: Facial and Oral Movements Muscles of Facial Expression: None, normal Lips and Perioral Area: None, normal Jaw: None, normal Tongue: None, normal,Extremity Movements Upper (arms, wrists, hands, fingers): None, normal Lower (legs, knees, ankles, toes): None, normal, Trunk Movements Neck, shoulders, hips: None, normal, Overall Severity Severity of abnormal movements (highest score from questions above): None, normal Incapacitation due to abnormal movements: None,  normal Patient's awareness of abnormal movements (rate only patient's report): No Awareness, Dental Status Current problems with teeth and/or dentures?: No Does patient usually wear dentures?: No  CIWA:  CIWA-Ar Total: 0 COWS:  COWS Total Score: 0  Treatment Plan Summary: Daily contact with patient to assess and evaluate symptoms and progress in treatment Medication management  Plan:  Review of chart, vital signs, medications, and notes. 1-Individual and group therapy 2-Medication management for depression and anxiety:  Medications reviewed with the patient and  continue Seroquel 25 mg 3 times a day Zoloft 25 mg daily and Seroquel 50 mg at bedtime   continue Trazodone 100 mg for sleep issues 3-Coping skills for depression, anxiety, and substance use 4-Continue crisis stabilization and management 5-Address health issues--monitoring vital signs, stable 6-Treatment plan in progress to prevent relapse of depression, alcohol use, and anxiety  Medical Decision Making Problem Points:  Established problem, stable/improving (1) and Review of psycho-social stressors (1) Data Points:  Review of medication regiment & side effects (2)  I certify that inpatient services furnished can reasonably be expected to improve the patient's condition.   Beau Fanny, FNP-BC 05/26/2013, 2:15 PM  Reviewed the information documented and agree with the treatment plan.  Lindell Renfrew,JANARDHAHA R. 05/26/2013 6:36 PM

## 2013-05-26 NOTE — Progress Notes (Signed)
The focus of this group is to educate the patient on the purpose and policies of crisis stabilization and provide a format to answer questions about their admission.  The group details unit policies and expectations of patients while admitted.  Patient attended 0900 nurse education orientation group this morning.  Patient listened attentively, appropriate affect, alert, appropriate insight and engagement.  Today patient will work on 3 goals for discharge.  

## 2013-05-26 NOTE — Progress Notes (Signed)
Pt attended spiritual care group on grief and loss facilitated by chaplain Regis Wiland   Group opened with brief discussion and psycho-social ed around grief and loss in relationships and in relation to self - identifying life patterns, circumstances, changes that cause losses. Established group norm of speaking from own life experience. Group goal of establishing open and affirming space for members to share loss and experience with grief, normalize grief experience and provide psycho social education and grief support.            Parker Berg Wayne MDiv  

## 2013-05-26 NOTE — Progress Notes (Signed)
Patient ID: Parker BergamoMarcus Berg, male   DOB: 02/18/1966, 48 y.o.   MRN: [id number] D-Patient has been attending most groups.  He has been engaged in group socializing.  He says that he has been charging his anklet. He is complaining of back pain and has taken tylenol for it.  A- Supported patient  And praised his artwork.  R- Patient continues to work on Psychologist, educationalart and says that he hopes to get in long term treatment.

## 2013-05-26 NOTE — Progress Notes (Signed)
Adult Psychoeducational Group Note  Date:  05/26/2013 Time:  8:00 pm  Group Topic/Focus:  Wrap-Up Group:   The focus of this group is to help patients review their daily goal of treatment and discuss progress on daily workbooks.  Participation Level:  Active  Participation Quality:  Appropriate and Sharing  Affect:  Appropriate  Cognitive:  Appropriate  Insight: Appropriate  Engagement in Group:  Engaged  Modes of Intervention:  Discussion, Education, Socialization and Support  Additional Comments:  Pt stated that he has been accepted in to a year long treatment program. Pt stated that he is anxious but he needs long term help to assist him with his drug addiction. Pt stated he has been using drugs for 30 years.   Laural BenesJohnson, Damen Windsor 05/26/2013, 11:37 PM

## 2013-05-26 NOTE — [facility] Group Notes (Signed)
[facility] LCSW Group Therapy  05/26/2013 2:39 PM  Type of Therapy:  Group Therapy  Participation Level:  Active  Participation Quality:  Attentive  Affect:  Appropriate  Cognitive:  Alert and Oriented  Insight:  Engaged  Engagement in Therapy:  Engaged  Modes of Intervention:  Discussion, Education, Exploration, Problem-solving, Rapport Building, Socialization and Support  Summary of Progress/Problems: [facility] Speaker came to talk about his personal journey with substance abuse and addiction. The pt processed ways by which to relate to the speaker. MHA speaker provided handouts and educational information pertaining to groups and services offered by the [facility]. Parker Berg was attentive and engaged throughout today's therapy group. He actively listened as speaker shared his personal story. Parker Berg thanked the speaker for coming when group ended.     Smart, Papa Piercefield LCSWA  05/26/2013, 2:39 PM

## 2013-05-26 NOTE — [facility] Group Notes (Signed)
[facility] LCSW Group Therapy  Feelings Around Diagnosis 1:15 - 2:30             05/26/2013  2:52 PM    Type of Therapy:  Group Therapy  Participation Level:  Did not attend group.  Wynn BankerHodnett, Aydien Majette Hairston 05/26/2013  2:52 PM

## 2013-05-26 NOTE — Progress Notes (Signed)
Recreation Therapy Notes  Animal-Assisted Activity/Therapy (AAA/T) Program Checklist/Progress Notes Patient Eligibility Criteria Checklist & Daily Group note for Rec Tx Intervention  Date: 01.20.2015 Time: 2:45pm [address]    AAA/T Program Assumption of Risk Form signed by Patient/ or Parent Legal Guardian yes  Patient is free of allergies or sever asthma yes  Patient reports no fear of animals yes  Patient reports no history of cruelty to animals yes   Patient understands his/her participation is voluntary yes  Behavioral Response: Did not attend. Patient entered dayroom looking for coffee, when there was none available patient became agitated, going into the hall and throwing his empty coffee cup at the wall. Patient returned to session, but only to throw cup in the trash can.   Marykay Lexenise L Mitsue Peery, LRT/CTRS  Jearl KlinefelterBlanchfield, Kyzer Blowe L 05/26/2013 4:24 PM

## 2013-05-27 NOTE — Progress Notes (Signed)
Patient ID: [name], male   DOB: 05/06/1966, 48 y.o.   MRN: [id number]  D: Pt. Denies HI and Visual Hallucinations. Patient does report that he has passive SI but verbally contracts for safety. Patient also reports that he hears whispers but they are not telling him to do anything. Pt rates his depression and hopelessness at 5/10 for the day. Patient did have lower back pain this morning and was given PRN Tylenol but reported that his pain decreased. Patient was seen in the hallway midmorning and was tearful and stated that his court date had been moved up to this upcoming Monday. Patient stated, "I'm scared" and began crying harder.  A: Writer offered emotional support and encouragement to the patient. Scheduled medications given to patient per physician's orders and PRN Tylenol.  R: Patient is receptive and cooperative but is not attending many groups. Patient is seen in the milieu but rarely by this Clinical research associatewriter. Q15 minute checks are maintained for safety.

## 2013-05-27 NOTE — Progress Notes (Signed)
Patient ID: Parker Berg, male   DOB: 10/10/1965, 48 y.o.   MRN: [id number] [facility]  05/27/2013 11:44 AM Parker Berg  MRN:  [id number]  Subjective:  Patient has been suffering with major depressive disorder, anxiety disorder and alcohol dependence, cocaine abuse and also reportedly eating pain medication whenever he can get onto his hands. Patient reports improved sleep and appetite. Rates anxiety at 5/10 and depression at 5/10. Pt was tearful after phone call this morning learning that his court date was moved up and that he may face prison time for "cutting off my ankle bracelet while I was drunk....that whole court thing really upsets me because I can't be two places at once..I can't be in treatment here and in prison there and I really want to get help." Denies SI, HI, and AVH, and contracts for safety while in hospital. Denies any other physical or psychological complaints at this time.   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 GERD (gastroesophageal reflux disease)   . Depression   . Anxiety    Axis IV: economic problems, housing problems, other psychosocial or environmental problems, problems related to legal system/crime, problems related to social environment and problems with primary support group Axis V: 41-50 serious symptoms  ADL's:  Intact  Sleep: Good  Appetite:  Good  Suicidal Ideation:  Denies Homicidal Ideation:  Denies   Psychiatric Specialty Exam: Review of Systems  Constitutional: Negative.   HENT: Negative.   Eyes: Negative.   Respiratory: Negative.   Cardiovascular: Negative.   Gastrointestinal: Negative.   Genitourinary: Negative.    Musculoskeletal: Negative.   Skin: Negative.   Neurological: Negative.   Endo/Heme/Allergies: Negative.   Psychiatric/Behavioral: Positive for depression and substance abuse. Negative for hallucinations. The patient has insomnia.     Blood pressure 118/80, pulse 61, temperature 97.4 F (36.3 C), temperature source Oral, resp. rate 20, height 5\' 7"  (1.702 m), weight 68.493 kg (151 lb).Body mass index is 23.64 kg/(m^2).  General Appearance: Disheveled  Eye Contact::  Minimal  Speech:  Slow  Volume:  Normal  Mood:  Depressed  Affect:  Congruent  Thought Process:  Coherent  Orientation:  Full (Time, Place, and Person)  Thought Content:  WDL  Suicidal Thoughts:  No  Homicidal Thoughts:  No  Memory:  Immediate;   Fair Recent;   Fair Remote;   Fair  Judgement:  Poor  Insight:  Lacking  Psychomotor Activity:  Decreased  Concentration:  Fair  Recall:  Poor  Akathisia:  No  Handed:  Right  AIMS (if indicated):     Assets:  Physical Health Resilience  Sleep:  6   Current Medications: Current Facility-Administered Medications  Medication Dose Route Frequency Provider Last Rate Last Dose  . acetaminophen (TYLENOL) tablet 650 mg  650 mg Oral Q6H PRN Earney NavyJosephine C Onuoha, NP   650 mg at 05/27/13 0845  . alum & mag hydroxide-simeth (MAALOX/MYLANTA) 200-200-20 MG/5ML suspension 30 mL  30 mL Oral Q4H PRN Earney NavyJosephine C Onuoha, NP      . magnesium hydroxide (MILK OF MAGNESIA) suspension 30 mL  30 mL Oral Daily PRN Earney NavyJosephine C Onuoha, NP      . ondansetron (ZOFRAN) tablet 4 mg  4 mg  Oral Q8H PRN Earney Navy, NP      . QUEtiapine (SEROQUEL) tablet 25 mg  25 mg Oral TID Nanine Means, NP   25 mg at 05/27/13 0845  . QUEtiapine (SEROQUEL) tablet 50 mg  50 mg Oral QHS Earney Navy, NP   50 mg at 05/26/13 2250  . sertraline (ZOLOFT) tablet 25 mg  25 mg Oral Daily Nanine Means, NP   25 mg at 05/27/13 0845  . traZODone (DESYREL) tablet 100 mg  100 mg Oral QHS PRN,MR X 1 Nanine Means, NP    100 mg at 05/24/13 2140    Lab Results: No results found for this or any previous visit (from the past 48 hour(s)).  Physical Findings: AIMS: Facial and Oral Movements Muscles of Facial Expression: None, normal Lips and Perioral Area: None, normal Jaw: None, normal Tongue: None, normal,Extremity Movements Upper (arms, wrists, hands, fingers): None, normal Lower (legs, knees, ankles, toes): None, normal, Trunk Movements Neck, shoulders, hips: None, normal, Overall Severity Severity of abnormal movements (highest score from questions above): None, normal Incapacitation due to abnormal movements: None, normal Patient's awareness of abnormal movements (rate only patient's report): No Awareness, Dental Status Current problems with teeth and/or dentures?: No Does patient usually wear dentures?: No  CIWA:  CIWA-Ar Total: 0 COWS:  COWS Total Score: 0  Treatment Plan Summary: Daily contact with patient to assess and evaluate symptoms and progress in treatment Medication management for major depressive disorder and insomnia  Plan:  Review of chart, vital signs, medications, and notes. 1-Individual and group therapy 2-Medication management for depression and anxiety:  Medications reviewed with the patient and  continue Seroquel 25 mg 3 times a day Zoloft 25 mg daily and Seroquel 50 mg at bedtime   continue Trazodone 100 mg for sleep issues.  3-Coping skills for depression, anxiety, and substance use 4-Continue crisis stabilization and management 5-Address health issues--monitoring vital signs, stable 6-Treatment plan in progress to prevent relapse of depression, alcohol use, and anxiety  Medical Decision Making Problem Points:  Established problem, stable/improving (1) and Review of psycho-social stressors (1) Data Points:  Review of medication regiment & side effects (2)  I certify that inpatient services furnished can reasonably be expected to improve the patient's condition.    Beau Fanny, FNP-BC 05/27/2013, 11:44 AM  Reviewed the information documented and agree with the treatment plan.  Annalynn Centanni,JANARDHAHA R. 05/27/2013 6:33 PM

## 2013-05-27 NOTE — [facility] Group Notes (Signed)
[facility]   05/27/2013 9:32 AM    Participation Quality:  Appropraite  Mood/Affect:  Appropriate  Depression Rating:  5  Anxiety Rating:  5  Thoughts of Suicide:  No  Will you contract for safety?   NA  Current AVH:  No  Plan for Discharge/Comments:  Patient attended discharge planning group and actively participated in group. He was [facility]  Patient shared he is hopeful to stay with sister if discharge before Monday.  CSW provided all participants with daily workbook.   Transportation Means: Patient has transportation.   Supports:  Patient has a support system.   Lorece Keach, Joesph JulyQuylle Hairston

## 2013-05-27 NOTE — Progress Notes (Signed)
[facility] Group Notes:  (Nursing/MHT/Case Management/Adjunct)  Date:  05/27/2013  Time:  9:26 PM  Type of Therapy:  Group Therapy  Participation Level:  Active  Participation Quality:  Appropriate  Affect:  Appropriate  Cognitive:  Appropriate  Insight:  Appropriate  Engagement in Group:  Engaged  Modes of Intervention:  Discussion  Summary of Progress/Problems:The patient expressed that he learned he is going to a treatment center .  [name], Marizol Borror Lee 05/27/2013, 9:26 PM

## 2013-05-27 NOTE — Progress Notes (Signed)
Pt observed in the dayroom watching TV and talking with peers all evening.  He states he is doing ok and will probably be discharged tomorrow or Friday.  He is nervous about his court date.  He wants to go to long term treatment.  [facility], then he wants to go to a year long program.  Pt says he still has passive suicidal thoughts, but he can contract for safety.  He denies HI/AV.  Pt makes his needs known to staff.  Support and encouragement offered.  Safety maintained with q15 minute checks.

## 2013-05-27 NOTE — [facility] Suicide Risk Assessment (Signed)
[facility] INPATIENT:  Family/Significant Other Suicide Prevention Education  Suicide Prevention Education:  Education Completed; Parker AnchorsJuanita Strickland, Sister, [phone]; has been identified by the patient as the family member/significant other with whom the patient will be residing, and identified as the person(s) who will aid the patient in the event of a mental health crisis (suicidal ideations/suicide attempt).  With written consent from the patient, the family member/significant other has been provided the following suicide prevention education, prior to the and/or following the discharge of the patient.  The suicide prevention education provided includes the following:  Suicide risk factors  Suicide prevention and interventions  National Suicide Hotline telephone number  [facility] assessment telephone number  [facility] Emergency Assistance 911  [facility] and/or [facility]idential Mobile Crisis Unit telephone number  Request made of family/significant other to:  Remove weapons (e.g., guns, rifles, knives), all items previously/currently identified as safety concern.  Sister advised patient does not have access to guns.  Remove drugs/medications (over-the-counter, prescriptions, illicit drugs), all items previously/currently identified as a safety concern.  The family member/significant other verbalizes understanding of the suicide prevention education information provided.  The family member/significant other agrees to remove the items of safety concern listed above.  Wynn BankerHodnett, Jaquis Picklesimer Hairston 05/27/2013, 11:41 AM

## 2013-05-27 NOTE — [facility] Group Notes (Signed)
[facility]  05/27/2013 3:28 PM  Type of Therapy:  Group Therapy  Participation Level:  Did Not Attend  Parker BankerHodnett, Marquavion Venhuizen Hairston 05/27/2013, 3:28 PM

## 2013-05-27 NOTE — Tx Team (Signed)
Interdisciplinary Treatment Plan Update   Date Reviewed:  05/27/2013  Time Reviewed:  8:27 AM  Progress in Treatment:   Attending groups: Yes Participating in groups: Yes Taking medication as prescribed: Yes  Tolerating medication: Yes Family/Significant other contact made: No, but message left for sister Patient understands diagnosis: Yes  Discussing patient identified problems/goals with staff: Yes Medical problems stabilized or resolved: Yes Denies suicidal/homicidal ideation: Yes Patient has not harmed self or others: Yes  For review of initial/current patient goals, please see plan of care.  Estimated Length of Stay:  2-4 days  Reasons for Continued Hospitalization:  Anxiety Depression Medication stabilization   New Problems/Goals identified:    Discharge Plan or Barriers:   [facility]  Additional Comments:  Attendees:  Patient:  05/27/2013 8:27 AM   Signature: Mervyn GayJ. Jonnalagadda, MD 05/27/2013 8:27 AM  Signature:   05/27/2013 8:27 AM  Signature:  Claudette Headonrad Withrow, NP 05/27/2013 8:27 AM  Signature: 05/27/2013 8:27 AM  Signature:  05/27/2013 8:27 AM  Signature:  Juline PatchQuylle Donney Caraveo, LCSW 05/27/2013 8:27 AM  Signature:  Reyes Ivanhelsea Horton, LCSW 05/27/2013 8:27 AM  Signature:  Leisa LenzValerie Enoch, Care Coordinator 05/27/2013 8:27 AM  Signature:  Aloha GellKrista Dopson, RN 05/27/2013 8:27 AM  Signature: Leighton ParodyBritney Tyson, RN 05/27/2013  8:27 AM  Signature:   Onnie BoerJennifer Clark, RN [facility] 05/27/2013  8:27 AM  Signature:   05/27/2013  8:27 AM    Scribe for Treatment Team:   Juline PatchQuylle Coben Godshall,  05/27/2013 8:27 AM

## 2013-05-28 NOTE — Progress Notes (Signed)
Adult Psychoeducational Group Note  Date:  05/28/2013 Time:  10:35 PM  Group Topic/Focus:  Goals Group:   The focus of this group is to help patients establish daily goals to achieve during treatment and discuss how the patient can incorporate goal setting into their daily lives to aide in recovery.  Participation Level:  Active  Participation Quality:  Appropriate  Affect:  Appropriate  Cognitive:  Appropriate  Insight: Appropriate  Engagement in Group:  Engaged  Modes of Intervention:  Discussion  Additional Comments:  Pt stated that he is content with the program and is looking forward to his treatment after he gets out.  Terie PurserParker, Justina Bertini R 05/28/2013, 10:35 PM

## 2013-05-28 NOTE — Progress Notes (Signed)
Patient ID: Parker Berg, male   DOB: May 10, 1965, 48 y.o.   MRN: [id number] [facility]: Lambert Rhonda W MD Progress Note  05/28/2013 12:26 PM Parker Berg  MRN:  [id number]  Subjective:  Patient has been suffering with major depressive disorder, anxiety disorder and alcohol dependence, cocaine abuse and also reportedly eating pain medication whenever he can get onto his hands. Patient reports improved sleep and appetite. Rates anxiety at 5/10 and depression at 5/10. Pt was tearful after phone call last night learning that his court date was moved up and that he may face prison time for "cutting off my ankle bracelet while I was drunk....that whole court thing really upsets me because I can't be two places at once..I can't be in treatment here and in prison there and I really want to get help."   Pt states that he is "about the same as yesterday, but slightly better". Still rates depression and anxiety both at 5/10. Pt is in agreement with medication regimen and treatment plan. Denies SI, HI, and AVH, and contracts for safety while in hospital. Denies any other physical or psychological complaints at this time.   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 GERD (gastroesophageal reflux disease)   . Depression   . Anxiety    Axis IV: economic problems, housing problems, other psychosocial or environmental problems, problems related to legal system/crime, problems related to social environment and problems with primary support group Axis V: 41-50 serious symptoms  ADL's:  Intact  Sleep: Good  Appetite:  Good  Suicidal Ideation:  Denies Homicidal Ideation:  Denies   Psychiatric Specialty Exam: Review of Systems   Constitutional: Negative.   HENT: Negative.   Eyes: Negative.   Respiratory: Negative.   Cardiovascular: Negative.   Gastrointestinal: Negative.   Genitourinary: Negative.   Musculoskeletal: Negative.   Skin: Negative.   Neurological: Negative.   Endo/Heme/Allergies: Negative.   Psychiatric/Behavioral: Positive for depression and substance abuse. Negative for hallucinations. The patient has insomnia.     Blood pressure 134/81, pulse 81, temperature 97.7 F (36.5 C), temperature source Oral, resp. rate 18, height 5\' 7"  (1.702 m), weight 68.493 kg (151 lb).Body mass index is 23.64 kg/(m^2).  General Appearance: Disheveled  Eye Contact::  Minimal  Speech:  Slow  Volume:  Normal  Mood:  Depressed  Affect:  Congruent  Thought Process:  Coherent  Orientation:  Full (Time, Place, and Person)  Thought Content:  WDL  Suicidal Thoughts:  No  Homicidal Thoughts:  No  Memory:  Immediate;   Fair Recent;   Fair Remote;   Fair  Judgement:  Poor  Insight:  Lacking  Psychomotor Activity:  Decreased  Concentration:  Fair  Recall:  Poor  Akathisia:  No  Handed:  Right  AIMS (if indicated):     Assets:  Physical Health Resilience  Sleep:  6   Current Medications: Current Facility-Administered Medications  Medication Dose Route Frequency Provider Last Rate Last Dose  . acetaminophen (TYLENOL) tablet 650 mg  650 mg Oral Q6H PRN Earney Navy, NP   650 mg at 05/27/13 0845  . alum & mag hydroxide-simeth (MAALOX/MYLANTA) 200-200-20 MG/5ML suspension 30 mL  30 mL Oral Q4H PRN Earney Navy, NP      .  magnesium hydroxide (MILK OF MAGNESIA) suspension 30 mL  30 mL Oral Daily PRN Earney NavyJosephine C Onuoha, NP      . ondansetron (ZOFRAN) tablet 4 mg  4 mg Oral Q8H PRN Earney NavyJosephine C Onuoha, NP      . QUEtiapine (SEROQUEL) tablet 25 mg  25 mg Oral TID Nanine MeansJamison Lord, NP   25 mg at 05/28/13 1214  . QUEtiapine (SEROQUEL) tablet 50 mg  50 mg Oral QHS Earney NavyJosephine C Onuoha, NP   50 mg at 05/27/13 2231  .  sertraline (ZOLOFT) tablet 25 mg  25 mg Oral Daily Nanine MeansJamison Lord, NP   25 mg at 05/28/13 0854  . traZODone (DESYREL) tablet 100 mg  100 mg Oral QHS PRN,MR X 1 Nanine MeansJamison Lord, NP   100 mg at 05/24/13 2140    Lab Results: No results found for this or any previous visit (from the past 48 hour(s)).  Physical Findings: AIMS: Facial and Oral Movements Muscles of Facial Expression: None, normal Lips and Perioral Area: None, normal Jaw: None, normal Tongue: None, normal,Extremity Movements Upper (arms, wrists, hands, fingers): None, normal Lower (legs, knees, ankles, toes): None, normal, Trunk Movements Neck, shoulders, hips: None, normal, Overall Severity Severity of abnormal movements (highest score from questions above): None, normal Incapacitation due to abnormal movements: None, normal Patient's awareness of abnormal movements (rate only patient's report): No Awareness, Dental Status Current problems with teeth and/or dentures?: No Does patient usually wear dentures?: No  CIWA:  CIWA-Ar Total: 0 COWS:  COWS Total Score: 0  Treatment Plan Summary: Daily contact with patient to assess and evaluate symptoms and progress in treatment Medication management for major depressive disorder and insomnia  Plan:  Review of chart, vital signs, medications, and notes. 1-Individual and group therapy 2-Medication management for depression and anxiety:  Medications reviewed with the patient and  continue Seroquel 25 mg 3 times a day Zoloft 25 mg daily and Seroquel 50 mg at bedtime   continue Trazodone 100 mg for sleep issues.  3-Coping skills for depression, anxiety, and substance use 4-Continue crisis stabilization and management 5-Address health issues--monitoring vital signs, stable 6-Treatment plan in progress to prevent relapse of depression, alcohol use, and anxiety  Medical Decision Making Problem Points:  Established problem, stable/improving (1) and Review of psycho-social stressors (1) Data  Points:  Review of medication regiment & side effects (2)  I certify that inpatient services furnished can reasonably be expected to improve the patient's condition.   Beau FannyWithrow, John C, FNP-BC 05/28/2013, 12:26 PM  Reviewed the information documented and agree with the treatment plan.  Nitisha Civello,JANARDHAHA R. 05/29/2013 6:28 PM

## 2013-05-28 NOTE — Progress Notes (Signed)
Recreation Therapy Notes  Date: 01.21.2015 Time: 3:00pm Location: [address] Day[address]   Group Topic: Communication, Team Building, Problem Solving  Goal Area(s) Addresses:  Patient will effectively work with peer towards shared goal.  Patient will identify skill used to make activity successful.  Patient will identify how skills used during activity can be used to reach post d/c goals.   Behavioral Response: Did not attend   Jearl Klinefelterenise L Alisha Bacus, LRT/CTRS  Sebrina Kessner L 05/28/2013 9:22 AM

## 2013-05-28 NOTE — Progress Notes (Signed)
Patient ID: Parker BergamoMarcus Ewer, male   DOB: 11/19/1965, 48 y.o.   MRN: [id number]  D: Pt. Denies SI/HI and A/V Hallucinations to writer during morning assessment. Shortly after patient turned in daily inventory sheet and reported off and on thoughts of SI so writer spoke with patient and patient was able to verbally contract for safety. Patient does not report any pain or discomfort at this time. Patient reports his depression and hopelessness is 8/10 for the day. Patient reports, "I dont think I am ready to leave." Patient also reports that he does not know how he will pay for his medications after discharge.  A: Support and encouragement provided to the patient to speak to writer about any questions or concerns. Scheduled medications are administered to patient per physician's orders.   R: Patient is receptive and cooperative. Patient is seen in the milieu and is attending groups. Q15 minute checks are maintained for safety.

## 2013-05-28 NOTE — Progress Notes (Signed)
Patient ID: [name], male   DOB: 06/11/1965, 48 y.o.   MRN: [id number]  Morning Wellness Group 9:00 AM  The focus of this group is to educate the patient on the purpose and policies of crisis stabilization and provide a format to answer questions about their admission.  The group details unit policies and expectations of patients while admitted.  Patient did attend group and was listening to RN staff. Patient was seen working on his leisure and lifestyle workbook. Patient laughed along with jokes and stated that he drew a drawing hanging up in the milieu.

## 2013-05-28 NOTE — Progress Notes (Signed)
D: Pt denies SI/HI/AVH. Pt is pleasant and cooperative. Pt observed interacting in the dayroom, watching TV. Pt just stated he wanted to go to long term facility after [facility].     A: Pt was offered support and encouragement. Pt was given scheduled medications. Pt was encourage to attend groups. Q 15 minute checks were done for safety.   R:Pt attends groups and interacts well with peers and staff. Pt is taking medication. Pt has no complaints at this time.Pt receptive to treatment and safety maintained on unit.
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
## 2013-05-28 NOTE — Progress Notes (Signed)
Recreation Therapy Notes  Animal-Assisted Activity/Therapy (AAA/T) Program Checklist/Progress Notes Patient Eligibility Criteria Checklist & Daily Group note for Rec Tx Intervention  Date: 01.22.2015 Time: 2:45pm Location: [address] Day[address]    AAA/T Program Assumption of Risk Form signed by Patient/ or Parent Legal Guardian yes  Patient is free of allergies or sever asthma yes  Patient reports no fear of animals yes  Patient reports no history of cruelty to animals yes   Patient understands his/her participation is voluntary yes  Patient washes hands before animal contact yes  Patient washes hands after animal contact yes  Behavioral Response: Engaged, Appropraite  Education: Hand Washing, Appropriate Animal Interaction   Education Outcome: Acknowledges understanding  Clinical Observations/Feedback: Patient interacted appropriately with peers and therapy dog team.   Edrei Norgaard L Matias Thurman, LRT/CTRS  Carnetta Losada L 05/28/2013 3:50 PM 

## 2013-05-28 NOTE — Clinical Social Work Note (Signed)
Late Entry for 05/27/13:  Writer spoke with patient's attorney to advise him of recommendation for patient to go into residential treatment at discharge.  Patient had advised staff of court date being moved up to June 01, 2013, the day is scheduled for admission for residential treatment.  Attorney, Loma SenderLarry Archie, asked that a letter with MD recommendation for treatment be faxed to him and he would present it to the court.  He asked that Clinical research associatewriter contact him on 06/03/13 regarding court decision.  He was informed that writer would not be able to follow up with him on the decision.  Letter faxed to patient's attorney today.  Writer left message on attorney's voicemail informing him of letter being faxed.  He was also advised that once patient discharges from there hospital on 06/01/13, we will have no further contact unless patient readmitted.  Attorney advised he and patient will need to make contact with each other regarding court situation.

## 2013-05-29 NOTE — Progress Notes (Signed)
Adult Psychoeducational Group Note  Date:  05/29/2013 Time:  08:00pm Group Topic/Focus:  Wrap-Up Group:   The focus of this group is to help patients review their daily goal of treatment and discuss progress on daily workbooks.  Participation Level:  Active  Participation Quality:  Appropriate and Attentive  Affect:  Appropriate  Cognitive:  Alert and Appropriate  Insight: Appropriate  Engagement in Group:  Engaged  Modes of Intervention:  Discussion and Education  Additional Comments:  Pt attended and participated in group. Discussion was on how their day went. Pt stated he had a good day looking forward to treatment. Pt stated " I need to clean out my closet mentally and get a new wardrobe."   Shelly BombardGarner, Dariusz Brase D 05/29/2013, 9:21 PM

## 2013-05-29 NOTE — [facility] Group Notes (Signed)
[facility] Dis[facility] Planning Group Note   05/29/2013 10:53 AM    Participation Quality:  Appropraite  Mood/Affect:  Appropriate  Depression Rating:  6  Anxiety Rating:  7  Thoughts of Suicide:  No  Will you contract for safety?   NA  Current AVH:  No  Plan for Discharge/Comments:  Patient attended discharge planning group and actively participated in group.  He reports doing better but continues to endorse depression and anxiety.  [facility]   CSW provided all participants with daily workbook.   Transportation Means: Patient has transportation.   Supports:  Patient has a support system.   Kimbree Casanas, Joesph JulyQuylle Hairston

## 2013-05-29 NOTE — Progress Notes (Signed)
Patient ID: Parker BergamoMarcus Berg, male   DOB: 09/13/1965, 48 y.o.   MRN: [id number] Patient ID: Parker BergamoMarcus Berg, male   DOB: 06/13/1965, 48 y.o.   MRN: [id number] [facility]  05/29/2013 11:23 AM Parker BergamoMarcus Berg  MRN:  [id number]  Subjective:  Patient has been treated with major depressive disorder, anxiety disorder and alcohol dependence, cocaine abuse and pain medication. Patient reports improved sleep and appetite. Patient has been highly anxious about going out of the hospital before substance abuse program accepts him because he may have a high chances of relapse in drug of abuse. Patient reported his bladder has been using drugs at his mother's home that is the only place he can go. Patient has been motivated to stay sober. He rates anxiety at 8/10 and depression at 7/10, hopelessness and helplessness 7/10. Patient stated he will be planning to date [facility] recoded service on Monday and after that he wanted to participate in either program called [facility]. Case manager has sent a letter to his attorney regarding his a substance abuse treatment, depression and anxiety. 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 GERD (gastroesophageal reflux disease)   . Depression   . Anxiety    Axis IV: economic problems, housing problems, other psychosocial or environmental  problems, problems related to legal system/crime, problems related to social environment and problems with primary support group Axis V: 41-50 serious symptoms  ADL's:  Intact  Sleep: Good  Appetite:  Good  Suicidal Ideation:  Denies Homicidal Ideation:  Denies   Psychiatric Specialty Exam: Review of Systems  Constitutional: Negative.   HENT: Negative.   Eyes: Negative.   Respiratory: Negative.   Cardiovascular: Negative.   Gastrointestinal: Negative.   Genitourinary: Negative.   Musculoskeletal: Negative.   Skin: Negative.   Neurological: Negative.   Endo/Heme/Allergies: Negative.   Psychiatric/Behavioral: Positive for depression and substance abuse. Negative for hallucinations. The patient has insomnia.     Blood pressure 128/86, pulse 67, temperature 97.4 F (36.3 C), temperature source Oral, resp. rate 16, height 5\' 7"  (1.702 m), weight 68.493 kg (151 lb).Body mass index is 23.64 kg/(m^2).  General Appearance: Disheveled  Eye Contact::  Minimal  Speech:  Slow  Volume:  Normal  Mood:  Depressed  Affect:  Congruent  Thought Process:  Coherent  Orientation:  Full (Time, Place, and Person)  Thought Content:  WDL  Suicidal Thoughts:  No  Homicidal Thoughts:  No  Memory:  Immediate;   Fair Recent;   Fair Remote;   Fair  Judgement:  Poor  Insight:  Lacking  Psychomotor Activity:  Decreased  Concentration:  Fair  Recall:  Poor  Akathisia:  No  Handed:  Right  AIMS (if indicated):     Assets:  Physical Health Resilience  Sleep:  6   Current Medications: Current Facility-Administered Medications  Medication Dose  Route Frequency Provider Last Rate Last Dose  . acetaminophen (TYLENOL) tablet 650 mg  650 mg Oral Q6H PRN Earney Navy, NP   650 mg at 05/27/13 0845  . alum & mag hydroxide-simeth (MAALOX/MYLANTA) 200-200-20 MG/5ML suspension 30 mL  30 mL Oral Q4H PRN Earney Navy, NP      . magnesium hydroxide (MILK OF MAGNESIA) suspension 30 mL  30 mL  Oral Daily PRN Earney Navy, NP      . ondansetron (ZOFRAN) tablet 4 mg  4 mg Oral Q8H PRN Earney Navy, NP      . QUEtiapine (SEROQUEL) tablet 25 mg  25 mg Oral TID Nanine Means, NP   25 mg at 05/29/13 0849  . QUEtiapine (SEROQUEL) tablet 50 mg  50 mg Oral QHS Earney Navy, NP   50 mg at 05/27/13 2231  . sertraline (ZOLOFT) tablet 25 mg  25 mg Oral Daily Nanine Means, NP   25 mg at 05/29/13 0849  . traZODone (DESYREL) tablet 100 mg  100 mg Oral QHS PRN,MR X 1 Nanine Means, NP   100 mg at 05/24/13 2140    Lab Results: No results found for this or any previous visit (from the past 48 hour(s)).  Physical Findings: AIMS: Facial and Oral Movements Muscles of Facial Expression: None, normal Lips and Perioral Area: None, normal Jaw: None, normal Tongue: None, normal,Extremity Movements Upper (arms, wrists, hands, fingers): None, normal Lower (legs, knees, ankles, toes): None, normal, Trunk Movements Neck, shoulders, hips: None, normal, Overall Severity Severity of abnormal movements (highest score from questions above): None, normal Incapacitation due to abnormal movements: None, normal Patient's awareness of abnormal movements (rate only patient's report): No Awareness, Dental Status Current problems with teeth and/or dentures?: No Does patient usually wear dentures?: No  CIWA:  CIWA-Ar Total: 0 COWS:  COWS Total Score: 0  Treatment Plan Summary: Daily contact with patient to assess and evaluate symptoms and progress in treatment Medication management for major depressive disorder and insomnia  Plan:  Review of chart, vital signs, medications, and notes. 1-Individual and group therapy 2-Medication management for depression and anxiety:  Medications reviewed with the patient and  continue Seroquel 25 mg 3 times a day, Zoloft 25 mg daily and Seroquel 50 mg at bedtime   continue Trazodone 100 mg for sleep issues.  3-Coping skills for depression, anxiety, and substance  use 4-Continue crisis stabilization and management 5-Address health issues--monitoring vital signs, stable 6-Treatment plan in progress to prevent relapse of depression, alcohol use, and anxiety 7. Disposition plans are in progress, discharged on Monday to the substance abuse day treatment program and required suicide risk assessment Sunday after 3 PM as per case manager.  Medical Decision Making Problem Points:  Established problem, stable/improving (1) and Review of psycho-social stressors (1) Data Points:  Review of medication regiment & side effects (2)  I certify that inpatient services furnished can reasonably be expected to improve the patient's condition.   Kyi Romanello,JANARDHAHA R.05/29/2013, 11:23 AM

## 2013-05-29 NOTE — Progress Notes (Signed)
D) Pt has been attending the groups and interacting with his peers. Rates his depression at an 8 and his hopelessness at a 7. Has been verbalizing concern and worry about leaving here on time on Monday and also if he leaves before he knows that he will use. Continues to have thoughts of SI on and off. A) Pt given support and reassurance along with praise. Encouragement given as well. R) Contracts for his safety on the unit.

## 2013-05-29 NOTE — Progress Notes (Signed)
Recreation Therapy Notes  Date: 01.23.2015 Time: 2:45pm Location: [address] Day[address]   Group Topic: Building Healthy Support System   Goal Area(s) Addresses:  Patient will identify qualities needed to build healthy support system. Patient will identify why those qualities are important.   Behavioral Response: Appropriate   Intervention: Scenario  Activity: Patients were asked to identify all qualities needed to build a healthy support system, as if they were equivalent to ingredients needed to make a cookie recipe.    Education:  Pharmacist, communityocial Skills, Building control surveyorDischarge Planning,   Education Outcome: Acknowledges understanding  Clinical Observations/Feedback: Patient attended group session, listened intently, but made no statements or contributions to group discussion.   Marykay Lexenise L Eladio Dentremont, LRT/CTRS  Jearl KlinefelterBlanchfield, Tranquilino Fischler L 05/29/2013 4:41 PM

## 2013-05-29 NOTE — [facility] Group Notes (Signed)
[facility] LCSW Group Therapy  Feelings Around Relapse 1:15 -2:30        05/29/2013  3:46 PM   Type of Therapy:  Group Therapy  Participation Level:  Appropriate  Participation Quality:  Appropriate  Affect:  Appropriate  Cognitive:  Attentive Appropriate  Insight:  Developing/Improving  Engagement in Therapy: Developing/Improving  Modes of Intervention:  Discussion Exploration Problem-Solving Supportive  Summary of Progress/Problems:  The topic for today was feelings around relapse.    Patient processed feelings toward relapse and was able to relate to peers. He advised he wold be making excuse to stay in his room away from the rest of the family.   Patient identified coping skills that can be used to prevent a relapse.   Parker Berg, Parker Berg 05/29/2013  3:46 PM
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
## 2013-05-29 NOTE — Tx Team (Signed)
Interdisciplinary Treatment Plan Update   Date Reviewed:  05/29/2013  Time Reviewed:  9:45 AM  Progress in Treatment:   Attending groups: Yes Participating in groups: Yes Taking medication as prescribed: Yes  Tolerating medication: Yes Family/Significant other contact made: Yes, collateral contact made with mother Patient understands diagnosis: Yes  Discussing patient identified problems/goals with staff: Yes Medical problems stabilized or resolved: Yes Denies suicidal/homicidal ideation: Yes Patient has not harmed self or others: Yes  For review of initial/current patient goals, please see plan of care.  Estimated Length of Stay:  3 days - Discharge Monday morning  Reasons for Continued Hospitalization:  Anxiety Depression Medication stabilization  New Problems/Goals identified:    Discharge Plan or Barriers:   Home [facility]  Additional Comments:  Attendees:  Patient:  05/29/2013 9:45 AM   Signature: Mervyn GayJ. Jonnalagadda, MD 05/29/2013 9:45 AM  Signature:   05/29/2013 9:45 AM  Signature:  Lamount Crankerhris Judge, RN  05/29/2013 9:45 AM  Signature: 05/29/2013 9:45 AM  Signature:  05/29/2013 9:45 AM  Signature:  Juline PatchQuylle Chadrick Sprinkle, LCSW 05/29/2013 9:45 AM  Signature:  Reyes Ivanhelsea Horton, LCSW 05/29/2013 9:45 AM  Signature:  Leisa LenzValerie Enoch, Care Coordinator 05/29/2013 9:45 AM  Signature:  05/29/2013 9:45 AM  Signature:  05/29/2013  9:45 AM  Signature:   Onnie BoerJennifer Clark, RN [facility] 05/29/2013  9:45 AM  Signature:   05/29/2013  9:45 AM    Scribe for Treatment Team:   Juline PatchQuylle Berlie Hatchel,  05/29/2013 9:45 AM

## 2013-05-29 NOTE — Progress Notes (Signed)
Adult Psychoeducational Group Note  Date:  05/29/2013 Time:  10:00am Group Topic/Focus:  Relapse Prevention Planning:   The focus of this group is to define relapse and discuss the need for planning to combat relapse.  Participation Level:  Active  Participation Quality:  Appropriate and Attentive  Affect:  Appropriate  Cognitive:  Alert and Appropriate  Insight: Appropriate  Engagement in Group:  Engaged  Modes of Intervention:  Discussion and Education  Additional Comments:  Pt attended and participated in group. Discussion was on relapse prevention and what their definition is. Pt stated relapse prevention means positive thinking and changing people places and things you are around as well as dealing with his anger.  Shelly BombardGarner, Saphia Vanderford D 05/29/2013, 1:25 PM

## 2013-05-30 DIAGNOSIS — F333 Major depressive disorder, recurrent, severe with psychotic symptoms: Secondary | ICD-10-CM

## 2013-05-30 NOTE — Progress Notes (Signed)
D) Pt has attended the groups and interacts with his peers appropriately. Rates his depression and hopelessness both at a 5 and denies SI and HI. Pt states that he feels that he has learned a lot here and will put it to good use when he leaves. Here. Talked about his hopefulness about going to a log term treatment center so "I can get my life back" A) Given support, reassurance and praise. Provided with a 1:1. Therapeutic humor used with Pt.  R) denies SI and HI. Looking forward to Discharge on Monday.

## 2013-05-30 NOTE — [facility] Group Notes (Signed)
[facility] LCSW Group Therapy Note  05/30/2013 1:15  To 2:15 PM  Type of Therapy and Topic:  Group Therapy: Avoiding Self-Sabotaging and Enabling Behaviors  Participation Level:  Did Not Attend     Parker Berg C Arushi Partridge, LCSW  

## 2013-05-30 NOTE — Progress Notes (Signed)
Psychoeducational Group Note  Date: 05/30/2013 Time:  1015  Group Topic/Focus:  Identifying Needs:   The focus of this group is to help patients identify their personal needs that have been historically problematic and identify healthy behaviors to address their needs.  Participation Level:  Active  Participation Quality:  Appropriate  Affect:  Appropriate  Cognitive:  Appropriate  Insight:  Improving  Engagement in Group:  Engaged  Additional Comments:    Donnis Pecha A 

## 2013-05-30 NOTE — Progress Notes (Signed)
Patient ID: Parker Berg, male   DOB: 12/23/1965, 48 y.o.   MRN: [id number] Patient ID: Parker Berg, male   DOB: 04/09/1966, 48 y.o.   MRN: [id number] [facility] Progress Note  05/30/2013 3:09 PM Parker Berg  MRN:  [id number]  Subjective:  Patient reports that he is doing OK, and better than before. He is beginning to feel better about himself. Patient reports improved sleep and appetite. Patient has been highly anxious about going out of the hospital before substance abuse program accepts him because he may have a high chances of relapse in drug of abuse. Patient has been motivated to stay sober. He rates anxiety at 5/10 and depression at 4-5/10, hopelessness and helplessness 7/10. Patient stated [facility] and after that he wanted to participate in a program called [facility] for about 1 year.    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 GERD (gastroesophageal reflux disease)   . Depression   . Anxiety    Axis IV: economic problems, housing problems, other psychosocial or environmental problems, problems related to legal system/crime, problems related to social environment and problems with primary support group Axis V: 41-50 serious symptoms  ADL's:  Intact  Sleep: Good  Appetite:  Good  Suicidal Ideation:  Denies Homicidal Ideation:  Denies   Psychiatric Specialty Exam: Review of Systems  Constitutional: Negative.   HENT: Negative.   Eyes: Negative.   Respiratory: Negative.   Cardiovascular: Negative.   Gastrointestinal: Negative.   Genitourinary: Negative.   Musculoskeletal: Negative.   Skin: Negative.   Neurological: Negative.    Endo/Heme/Allergies: Negative.   Psychiatric/Behavioral: Positive for depression and substance abuse. Negative for hallucinations. The patient has insomnia.   All other systems reviewed and are negative.    Blood pressure 130/85, pulse 70, temperature 97.7 F (36.5 C), temperature source Oral, resp. rate 20, height 5\' 7"  (1.702 m), weight 68.493 kg (151 lb).Body mass index is 23.64 kg/(m^2).  General Appearance: Disheveled  Eye Contact::  Minimal  Speech:  Slow  Volume:  Normal  Mood:  Depressed  Affect:  Congruent  Thought Process:  Coherent  Orientation:  Full (Time, Place, and Person)  Thought Content:  WDL  Suicidal Thoughts:  No  Homicidal Thoughts:  No  Memory:  Immediate;   Fair Recent;   Fair Remote;   Fair  Judgement:  Poor  Insight:  Lacking  Psychomotor Activity:  Decreased  Concentration:  Fair  Recall:  Poor  Akathisia:  No  Handed:  Right  AIMS (if indicated):     Assets:  Physical Health Resilience  Sleep:  6   Current Medications: Current Facility-Administered Medications  Medication Dose Route Frequency Provider Last Rate Last Dose  . acetaminophen (TYLENOL) tablet 650 mg  650 mg Oral Q6H PRN Earney NavyJosephine C Onuoha, NP   650 mg at 05/27/13 0845  . alum & mag hydroxide-simeth (MAALOX/MYLANTA) 200-200-20 MG/5ML suspension 30 mL  30 mL Oral Q4H PRN Earney NavyJosephine C Onuoha, NP      . magnesium hydroxide (MILK OF MAGNESIA) suspension 30 mL  30 mL Oral Daily PRN Earney NavyJosephine C Onuoha, NP      . ondansetron (ZOFRAN) tablet 4 mg  4  mg Oral Q8H PRN Earney Navy, NP      . QUEtiapine (SEROQUEL) tablet 25 mg  25 mg Oral TID Nanine Means, NP   25 mg at 05/30/13 1031  . QUEtiapine (SEROQUEL) tablet 50 mg  50 mg Oral QHS Earney Navy, NP   50 mg at 05/27/13 2231  . sertraline (ZOLOFT) tablet 25 mg  25 mg Oral Daily Nanine Means, NP   25 mg at 05/30/13 1031  . traZODone (DESYREL) tablet 100 mg  100 mg Oral QHS PRN,MR X 1 Nanine Means, NP   100 mg at 05/24/13 2140     Lab Results: No results found for this or any previous visit (from the past 48 hour(s)).  Physical Findings: AIMS: Facial and Oral Movements Muscles of Facial Expression: None, normal Lips and Perioral Area: None, normal Jaw: None, normal Tongue: None, normal,Extremity Movements Upper (arms, wrists, hands, fingers): None, normal Lower (legs, knees, ankles, toes): None, normal, Trunk Movements Neck, shoulders, hips: None, normal, Overall Severity Severity of abnormal movements (highest score from questions above): None, normal Incapacitation due to abnormal movements: None, normal Patient's awareness of abnormal movements (rate only patient's report): No Awareness, Dental Status Current problems with teeth and/or dentures?: No Does patient usually wear dentures?: No  CIWA:  CIWA-Ar Total: 0 COWS:  COWS Total Score: 0  Treatment Plan Summary: Daily contact with patient to assess and evaluate symptoms and progress in treatment Medication management for major depressive disorder and insomnia  Plan:  Review of chart, vital signs, medications, and notes. 1-Individual and group therapy 2-Medication management for depression and anxiety:  Medications reviewed with the patient and  continue Seroquel 25 mg 3 times a day, Zoloft 25 mg daily and Seroquel 50 mg at bedtime   continue Trazodone 100 mg for sleep issues.  3-Coping skills for depression, anxiety, and substance use 4-Continue crisis stabilization and management 5-Address health issues--monitoring vital signs, stable 6-Treatment plan in progress to prevent relapse of depression, alcohol use, and anxiety 7. Disposition plans are in progress, discharged on Monday to the substance abuse day treatment program and required suicide risk assessment Sunday after 3 PM as per case manager.  Medical Decision Making Problem Points:  Established problem, stable/improving (1) and Review of psycho-social stressors (1) Data Points:  Review of  medication regiment & side effects (2)  I certify that inpatient services furnished can reasonably be expected to improve the patient's condition.   Malachy Chamber S1/24/2015, 3:09 PM

## 2013-05-30 NOTE — [facility] Group Notes (Signed)
[facility] Group Notes:  (Nursing/MHT/Case Management/Adjunct)  Date:  05/30/2013  Time:  10:56 AM  Type of Therapy:  Psychoeducational Skills  Participation Level:  Active  Participation Quality:  Appropriate  Affect:  Blunted  Cognitive:  Alert  Insight:  Appropriate  Engagement in Group:  Engaged  Modes of Intervention:  Discussion  Summary of Progress/Problems:Pt stated his goal was to feel better about himself today  Nicole CellaWebb, Nevaya Nagele Guyes 05/30/2013, 10:56 AM

## 2013-05-30 NOTE — [facility] Counselor (Signed)
LCSW had patient sign consent form for [employer] at request of [facility] as [employer] is to drop off charger for pt's ankle bracelet. We are no to share any information with [employer] other than to confirm that patient is an admitted patient here; consent form has this properly noted.  Carney Bernatherine C Jabron Weese, LCSW

## 2013-05-30 NOTE — Progress Notes (Signed)
D: Pt denies SI/HI/AVH. Pt is pleasant and cooperative. Pt stated he was good everything coming along.    A: Pt was offered support and encouragement. Pt was given scheduled medications. Pt was encourage to attend groups. Q 15 minute checks were done for safety.   R:Pt attends groups and interacts well with peers and staff. Pt is taking medication.Pt receptive to treatment and safety maintained on unit.

## 2013-05-30 NOTE — Progress Notes (Signed)
Adult Psychoeducational Group Note  Date:  05/30/2013 Time:  8:52 PM  Group Topic/Focus:  Wrap-Up Group:   The focus of this group is to help patients review their daily goal of treatment and discuss progress on daily workbooks.  Participation Level:  Active  Participation Quality:  Appropriate  Affect:  Appropriate  Cognitive:  Appropriate  Insight: Appropriate  Engagement in Group:  Engaged  Modes of Intervention:  Discussion  Additional Comments:  Patient begrudgingly attended group; he did not want the television to be cut off. However, once group got started, he was actively participating. He said that he has realized that anger is a problem for him, and he is learning coping skills while here. He said that he learned in group that anger is a second emotion.   Rosilyn MingsMingia, Shadae Reino A 05/30/2013, 8:52 PM

## 2013-05-31 MED ORDER — QUETIAPINE FUMARATE 50 MG PO TABS: 50.0000 mg | ORAL_TABLET | Freq: Every day | ORAL | Status: AC

## 2013-05-31 MED ORDER — TRAZODONE HCL 100 MG PO TABS: 100.0000 mg | ORAL_TABLET | Freq: Every evening | ORAL | Status: AC | PRN

## 2013-05-31 MED ORDER — SERTRALINE HCL 25 MG PO TABS: 25.0000 mg | ORAL_TABLET | Freq: Every day | ORAL | Status: AC

## 2013-05-31 MED ORDER — QUETIAPINE FUMARATE 25 MG PO TABS: 25.0000 mg | ORAL_TABLET | Freq: Three times a day (TID) | ORAL | Status: AC

## 2013-05-31 NOTE — Progress Notes (Signed)
Patient ID: Parker Berg, male   DOB: 04/04/1966, 48 y.o.   MRN: [id number] D)  Pt has been resting quietly tonight, eyes closed, resp reg, unlabored, no c/o's voiced. A)  Will continue to monitor for safety, continue POC R)  Safety maintained.

## 2013-05-31 NOTE — Progress Notes (Signed)
Adult Psychoeducational Group Note  Date:  05/31/2013 Time:  7:17 PM  Group Topic/Focus:  Therapeutic  Activity   Participation Level:  Active  Participation Quality:  Appropriate and Attentive  Affect:  Appropriate and Excited  Cognitive:  Appropriate  Insight: Appropriate  Engagement in Group:  Engaged  Modes of Intervention:  Activity and Discussion  Parker MilesMercer, [name] N 05/31/2013, 7:17 PM

## 2013-05-31 NOTE — Progress Notes (Signed)
Pt initally did not want to take any of his am meds. However he did change his mind and did come up to the med window. The patient Judi Congdi explain to the nurse about all the tatoos he had on his arms. On his hands he had the names of two of his daughters and then told the nurse one of the girls had died in her twenties. Pt became sad after this and left to go back to his room. He denies SI or HI and does contract for safety.
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
## 2013-05-31 NOTE — Progress Notes (Signed)
Adult Psychoeducational Group Note  Date:  05/31/2013 Time:  9:39 PM  Group Topic/Focus:  Wrap-Up Group:   The focus of this group is to help patients review their daily goal of treatment and discuss progress on daily workbooks.  Participation Level:  Active  Participation Quality:  Appropriate  Affect:  Appropriate  Cognitive:  Appropriate  Insight: Appropriate  Engagement in Group:  Engaged  Modes of Intervention:  Discussion  Additional Comments: The patient expressed that he learn in group to channel his anger in a different way.The patient expressed that he to talk  situation out and let things go.  [name], Livian Vanderbeck Lee 05/31/2013, 9:39 PM

## 2013-05-31 NOTE — Progress Notes (Signed)
Psychoeducational Group Note  Date:  05/31/2013 Time:  1015  Group Topic/Focus:  Making Healthy Choices:   The focus of this group is to help patients identify negative/unhealthy choices they were using prior to admission and identify positive/healthier coping strategies to replace them upon discharge.  Participation Level:  Active  Participation Quality:  Appropriate  Affect:  Appropriate  Cognitive:  Oriented  Insight:  Improving  Engagement in Group:  Engaged  Additional Comments:    Devera Englander A 05/31/2013 

## 2013-05-31 NOTE — [facility] Suicide Risk Assessment (Signed)
Suicide Risk Assessment  Discharge Assessment     Demographic Factors:  Male, Divorced or widowed, Low socioeconomic status and Unemployed  Mental Status Per Nursing Assessment::   On Admission:  NA  Current Mental Status by Physician: NA General Appearance: Casual and Fairly Groomed  Eye Contact::  Fair  Speech:  Clear and Coherent  Volume:  Normal  Mood:  "okay"  Affect:  Restricted  Thought Process:  Coherent, Linear and Logical  Orientation:  Full (Time, Place, and Person)  Thought Content:  WDL  Suicidal Thoughts:  No  Homicidal Thoughts:  No  Memory:  Immediate;   Good Recent;   Good Remote;   Good  Judgement:  Intact  Insight:  Fair  Psychomotor Activity:  Normal  Concentration:  Fair  Recall:  Fair  Akathisia:  No  Handed:  Right  Assets:  Desire for Improvement Social Support   language intact   fund of knowledge is average     Loss Factors: Decrease in vocational status and Legal issues  Historical Factors: Family history of mental illness or substance abuse and death of daughter.  Risk Reduction Factors:   Religious beliefs about death, Living with another person, especially a relative, Positive social support and Positive coping skills or problem solving skills  Continued Clinical Symptoms:  Depression:   Comorbid alcohol abuse/dependence Alcohol/Substance Abuse/Dependencies  Cognitive Features That Contribute To Risk:  Thought constriction (tunnel vision)    Suicide Risk:  Minimal: No identifiable suicidal ideation.  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  GERD (gastroesophageal reflux disease)    .  Depression    .  Anxiety     Axis IV: economic problems, housing problems, other psychosocial or environmental problems, problems related to legal system/crime, problems related to social environment and problems with primary support group  AXIS V:  50  Plan Of Care/Follow-up recommendations:  Activity:  Increase as tolerated Diet:  Regular Tests:  None Other:  [facility].  Is patient on multiple antipsychotic therapies at discharge:  No   Has Patient had three or more failed trials of antipsychotic monotherapy by history:  No  Recommended Plan for Multiple Antipsychotic Therapies: NA  Jammy Stlouis 05/31/2013, 3:41 PM

## 2013-05-31 NOTE — Progress Notes (Signed)
Psychoeducational Group Note  Date: 05/31/2013 Time: 0930  Group Topic/Focus:  Gratefulness:  The focus of this group is to help patients identify what two things they are most grateful for in their lives. What helps ground them and to center them on their work to their recovery.  Participation Level:  Active  Participation Quality:  Attentive  Affect:  Appropriate  Cognitive:  Appropriate  Insight:  Improving  Engagement in Group:  Engaged  Additional Comments:    Oliveah Zwack A  

## 2013-05-31 NOTE — Progress Notes (Signed)
D: Pt denies SI/HI/AVH. Pt is pleasant and cooperative. Pt stated he was ready to go. Pt said he has learned to let things go rather than have them build up. Pt requested to be awakened at 0400 to start to D/C.   A: Pt was offered support and encouragement. Pt was given scheduled medications. Pt was encourage to attend groups. Q 15 minute checks were done for safety.   R:Pt attends groups and interacts well with peers and staff. Pt is taking medication. Pt has no complaints at this time.Pt receptive to treatment and safety maintained on unit.

## 2013-05-31 NOTE — [facility] Group Notes (Signed)
[facility] LCSW Group Therapy Note   05/31/2013  1:15 To 2:15 PM   Type of Therapy and Topic: Group Therapy: Feelings Around Returning Home & Establishing a Supportive Framework and Activity to Identify signs of Improvement or Decompensation   Participation Level: Minimal  Mood: Flat  Description of Group:  Patients first processed thoughts and feelings about up coming discharge. These included fears of upcoming changes, lack of change, new living environments, judgements and expectations from others and overall stigma of MH issues. We then discussed what is a supportive framework? What does it look like feel like and how do I discern it from and unhealthy non-supportive network? Learn how to cope when supports are not helpful and don't support you. Discuss what to do when your family/friends are not supportive.   Therapeutic Goals Addressed in Processing Group:  1. Patient will identify one healthy supportive network that they can use at discharge. 2. Patient will identify one factor of a supportive framework and how to tell it from an unhealthy network. 3. Patient able to identify one coping skill to use when they do not have positive supports from others. 4. Patient will demonstrate ability to communicate their needs through discussion and/or role plays.  Summary of Patient Progress:  Pt attended entire group session today. Patient appeared somewhat disengaged as others processed their anxiety about discharge and described healthy supports. Pt states his support will be treatment center he is going to tomorrow.   Carney Bernatherine C Harrill, LCSW

## 2013-06-01 NOTE — Progress Notes (Addendum)
Nurs Dischg Note:  D:Patient denies SI/HI/pain at this time. Pt appears calm and cooperative, and no distress noted. Pt given breakfast and escorted to the bus stop by security at 0535  A: All Personal items in locker returned to pt. Pt escorted to the lobby,before [facility].  R:  Pt States he will comply with LT and outpatient services, and take MEDS as prescribed.

## 2013-06-04 NOTE — Progress Notes (Signed)
Patient Discharge Instructions:  After Visit Summary (AVS):   Faxed to:  06/04/13 Discharge Summary Note:   Faxed to:  06/04/13 Psychiatric Admission Assessment Note:   Faxed to:  06/04/13 Suicide Risk Assessment - Discharge Assessment:   Faxed to:  06/04/13 Faxed/Sent to the Next Level Care provider:  06/04/13 [facility]ax[facility] to [facility] @ [phone] [facility]ax[facility] to [facility] @ [phone] Faxed to [facility] @ [phone]  Jerelene ReddenSheena E [place], 06/04/2013, 2:48 PM

## 2013-06-29 ENCOUNTER — Emergency Department (HOSPITAL_BASED_OUTPATIENT_CLINIC_OR_DEPARTMENT_OTHER)
Admission: EM | Admit: 2013-06-29 | Discharge: 2013-06-29 | Disposition: A | Discharge status: Home / Self Care | Attending: Emergency Medicine | Admitting: Emergency Medicine

## 2013-06-29 ENCOUNTER — Encounter (HOSPITAL_BASED_OUTPATIENT_CLINIC_OR_DEPARTMENT_OTHER): Payer: Self-pay | Admitting: Emergency Medicine

## 2013-06-29 ENCOUNTER — Emergency Department (HOSPITAL_BASED_OUTPATIENT_CLINIC_OR_DEPARTMENT_OTHER)

## 2013-06-29 DIAGNOSIS — F17200 Nicotine dependence, unspecified, uncomplicated: Secondary | ICD-10-CM | POA: Insufficient documentation

## 2013-06-29 DIAGNOSIS — J159 Unspecified bacterial pneumonia: Secondary | ICD-10-CM | POA: Insufficient documentation

## 2013-06-29 DIAGNOSIS — Z79899 Other long term (current) drug therapy: Secondary | ICD-10-CM | POA: Insufficient documentation

## 2013-06-29 DIAGNOSIS — M545 Low back pain, unspecified: Secondary | ICD-10-CM | POA: Insufficient documentation

## 2013-06-29 DIAGNOSIS — F411 Generalized anxiety disorder: Secondary | ICD-10-CM | POA: Insufficient documentation

## 2013-06-29 DIAGNOSIS — J189 Pneumonia, unspecified organism: Secondary | ICD-10-CM

## 2013-06-29 DIAGNOSIS — Z8719 Personal history of other diseases of the digestive system: Secondary | ICD-10-CM | POA: Insufficient documentation

## 2013-06-29 DIAGNOSIS — F3289 Other specified depressive episodes: Secondary | ICD-10-CM | POA: Insufficient documentation

## 2013-06-29 DIAGNOSIS — F329 Major depressive disorder, single episode, unspecified: Secondary | ICD-10-CM | POA: Insufficient documentation

## 2013-06-29 MED ORDER — HYDROCOD POLST-CHLORPHEN POLST 10-8 MG/5ML PO LQCR: 5.0000 mL | Freq: Two times a day (BID) | ORAL | Status: DC | PRN

## 2013-06-29 MED ORDER — HYDROCOD POLST-CHLORPHEN POLST 10-8 MG/5ML PO LQCR
5.0000 mL | Freq: Once | ORAL | Status: AC
  Administered 2013-06-29: 5 mL via ORAL
  Filled 2013-06-29: qty 5

## 2013-06-29 MED ORDER — LEVOFLOXACIN 500 MG PO TABS
500.0000 mg | ORAL_TABLET | Freq: Once | ORAL | Status: AC
  Administered 2013-06-29: 500 mg via ORAL
  Filled 2013-06-29: qty 1

## 2013-06-29 MED ORDER — LEVOFLOXACIN 500 MG PO TABS: 500.0000 mg | ORAL_TABLET | Freq: Every day | ORAL | Status: DC

## 2013-06-29 NOTE — ED Notes (Addendum)
[facility] is [facility]. Pt sent here for fever, cough and bodyaches. [facility].

## 2013-06-29 NOTE — ED Provider Notes (Signed)
CSN: [id number]     Arrival date & time 06/29/13  1504 History  This chart was scribed for Gerhard Munchobert Damar Petit, MD by Donne Anonayla Curran, ED Scribe. This patient was seen in room MH05/MH05 and the patient's care was started at 1546.    First MD Initiated Contact with Patient 06/29/13 1546     Chief Complaint  Patient presents with  . Fever  . Cough      The history is provided by the patient. No language interpreter was used.   HPI Comments: Parker Berg is a 48 y.o. male who presents to the Emergency Department complaining of 4-5 days of productive cough with associated fever (max 101 PTA) and chills. He also complains of mild right sided lower back pain. He denies abdominal pain, hematochezia, hematuria, dysuria, numbness or weakness in his extremities or any other symptoms. He has tried Robitussin and Tylenol (last dose at 3:00 pm today) for his symptoms. He states was sick 1 week ago with diarrhea which has since resolved. [facility]. He is a current everyday smoker.    Past Medical History  Diagnosis Date  . GERD (gastroesophageal reflux disease)   . Depression   . Anxiety    Past Surgical History  Procedure Laterality Date  . Cholecystectomy N/A 09/21/2012    Procedure: LAPAROSCOPIC CHOLECYSTECTOMY WITH INTRAOPERATIVE CHOLANGIOGRAM;  Surgeon: Robyne AskewPaul S Toth III, MD;  [facility];  Service: General;  Laterality: N/A;   No family history on file. History  Substance Use Topics  . Smoking status: Current Every Day Smoker    Types: Cigarettes  . Smokeless tobacco: Not on file  . Alcohol Use: Yes     Comment: normally    Review of Systems  Constitutional:       Per HPI, otherwise negative  HENT:       Per HPI, otherwise negative  Respiratory:       Per HPI, otherwise negative  Cardiovascular:       Per HPI, otherwise negative  Gastrointestinal: Negative for vomiting.  Endocrine:       Negative aside from HPI   Genitourinary:       Neg aside from HPI   Musculoskeletal:       Per HPI, otherwise negative  Skin: Negative.   Neurological: Negative for syncope.      Allergies  Review of patient's allergies indicates no known allergies.  Home Medications   Current Outpatient Rx  Name  Route  Sig  Dispense  Refill  . QUEtiapine (SEROQUEL) 25 MG tablet   Oral   Take 1 tablet (25 mg total) by mouth 3 (three) times daily.   90 tablet   0   . QUEtiapine (SEROQUEL) 50 MG tablet   Oral   Take 1 tablet (50 mg total) by mouth at bedtime.   30 tablet   0   . sertraline (ZOLOFT) 25 MG tablet   Oral   Take 1 tablet (25 mg total) by mouth daily.   30 tablet   0   . traZODone (DESYREL) 100 MG tablet   Oral   Take 1 tablet (100 mg total) by mouth at bedtime as needed and may repeat dose one time if needed for sleep.   30 tablet   0    BP 113/73  Pulse 93  Temp(Src) 101.1 F (38.4 C) (Oral)  Resp 18  SpO2 96%  Physical Exam  Nursing note and vitals reviewed. Constitutional:  He is oriented to person, place, and time. He appears well-developed. No distress.  HENT:  Head: Normocephalic and atraumatic.  Eyes: Conjunctivae and EOM are normal.  Cardiovascular: Normal rate and regular rhythm.   Pulmonary/Chest: Effort normal. No stridor. No respiratory distress.  Abdominal: He exhibits no distension.  Musculoskeletal: He exhibits no edema.  Right paraspinal lower back pain. No SI joint pain. No left sided paraspinal lower back pain.   Neurological: He is alert and oriented to person, place, and time.  Skin: Skin is warm and dry.  Psychiatric: He has a normal mood and affect. His speech is normal and behavior is normal. Judgment normal. Cognition and memory are not impaired. He expresses no suicidal ideation.    ED Course  Procedures (including critical care time) COORDINATION OF CARE: 3:46 PM Discussed treatment plan which includes CXR and Tussionex with pt at bedside and pt agreed  to plan.   Labs Review Labs Reviewed - No data to display Imaging Review Dg Chest 2 View  06/29/2013   CLINICAL DATA:  48 year old male with cough. Initial encounter.  EXAM: CHEST  2 VIEW  COMPARISON:  12/02/2012 and earlier.  FINDINGS: Mildly lower lung volumes on the frontal view. Stable cardiac size and mediastinal contours. Visualized tracheal air column is within normal limits. No pneumothorax. No pulmonary edema or pleural effusion. No confluent pulmonary opacity. Mild bilateral increased interstitial markings are not significantly changed. Trace fluid or thickening in the right minor fissure today. There is streaky increased opacity in the lingula. No acute osseous abnormality identified. Stable right upper quadrant surgical clips.  IMPRESSION: 1. Streaky increased opacity in the lingula suspicious for bronchopneumonia in this setting, or less likely simple atelectasis. 2. Otherwise stable chest.   Electronically Signed   By: Augusto Gamble M.D.   On: 06/29/2013 16:09    I have seen the x-ray, agree with the interpretation. MDM   Final diagnoses:  None    I personally performed the services described in this documentation, which was scribed in my presence. The recorded information has been reviewed and is accurate.   Is now presents from her rehabilitation facility with concerns of ongoing cough, chills, fever.  X-ray suggests pneumonia.  She was started on antibiotics.  Patient is otherwise hemodynamically stable, and in no distress.  She was appropriate for discharge with outpatient management.  The patient is in a monitored facility.  Gerhard Munch, MD 06/29/13 [phone]

## 2013-06-29 NOTE — ED Notes (Signed)
MD at bedside discussing test results and dispo plan of care. 

## 2013-06-29 NOTE — Discharge Instructions (Signed)
As discussed, there is evidence on your x-ray today.  It is important that you followup with your physician to have your condition and monitored and managed.  Return here if you develop new, or concerning changes in your condition.

## 2013-07-22 ENCOUNTER — Emergency Department (HOSPITAL_BASED_OUTPATIENT_CLINIC_OR_DEPARTMENT_OTHER)
Admission: EM | Admit: 2013-07-22 | Discharge: 2013-07-22 | Disposition: A | Discharge status: Home / Self Care | Attending: Emergency Medicine | Admitting: Emergency Medicine

## 2013-07-22 ENCOUNTER — Encounter (HOSPITAL_BASED_OUTPATIENT_CLINIC_OR_DEPARTMENT_OTHER): Payer: Self-pay | Admitting: Emergency Medicine

## 2013-07-22 DIAGNOSIS — F3289 Other specified depressive episodes: Secondary | ICD-10-CM | POA: Insufficient documentation

## 2013-07-22 DIAGNOSIS — J029 Acute pharyngitis, unspecified: Secondary | ICD-10-CM

## 2013-07-22 DIAGNOSIS — F329 Major depressive disorder, single episode, unspecified: Secondary | ICD-10-CM | POA: Insufficient documentation

## 2013-07-22 DIAGNOSIS — Z79899 Other long term (current) drug therapy: Secondary | ICD-10-CM | POA: Insufficient documentation

## 2013-07-22 DIAGNOSIS — Z8719 Personal history of other diseases of the digestive system: Secondary | ICD-10-CM | POA: Insufficient documentation

## 2013-07-22 DIAGNOSIS — Z792 Long term (current) use of antibiotics: Secondary | ICD-10-CM | POA: Insufficient documentation

## 2013-07-22 DIAGNOSIS — F411 Generalized anxiety disorder: Secondary | ICD-10-CM | POA: Insufficient documentation

## 2013-07-22 DIAGNOSIS — F17200 Nicotine dependence, unspecified, uncomplicated: Secondary | ICD-10-CM | POA: Insufficient documentation

## 2013-07-22 HISTORY — DX: Other psychoactive substance abuse, uncomplicated: F19.10

## 2013-07-22 LAB — RAPID STREP SCREEN (MED CTR MEBANE ONLY): Streptococcus, Group A Screen (Direct): NEGATIVE

## 2013-07-22 MED ORDER — KETOROLAC TROMETHAMINE 60 MG/2ML IM SOLN: 60.0000 mg | Freq: Once | INTRAMUSCULAR | Status: DC

## 2013-07-22 MED ORDER — DEXAMETHASONE SODIUM PHOSPHATE 10 MG/ML IJ SOLN
10.0000 mg | Freq: Once | INTRAMUSCULAR | Status: AC
  Administered 2013-07-22: 10 mg via INTRAMUSCULAR
  Filled 2013-07-22: qty 1

## 2013-07-22 NOTE — ED Notes (Signed)
PO fluids provided. 

## 2013-07-22 NOTE — ED Notes (Signed)
Sore throat and pain with swallowing x 3 days.

## 2013-07-22 NOTE — Discharge Instructions (Signed)
Pharyngitis °Pharyngitis is redness, pain, and swelling (inflammation) of your pharynx.  °CAUSES  °Pharyngitis is usually caused by infection. Most of the time, these infections are from viruses (viral) and are part of a cold. However, sometimes pharyngitis is caused by bacteria (bacterial). Pharyngitis can also be caused by allergies. Viral pharyngitis may be spread from person to person by coughing, sneezing, and personal items or utensils (cups, forks, spoons, toothbrushes). Bacterial pharyngitis may be spread from person to person by more intimate contact, such as kissing.  °SIGNS AND SYMPTOMS  °Symptoms of pharyngitis include:   °· Sore throat.   °· Tiredness (fatigue).   °· Low-grade fever.   °· Headache. °· Joint pain and muscle aches. °· Skin rashes. °· Swollen lymph nodes. °· Plaque-like film on throat or tonsils (often seen with bacterial pharyngitis). °DIAGNOSIS  °Your health care provider will ask you questions about your illness and your symptoms. Your medical history, along with a physical exam, is often all that is needed to diagnose pharyngitis. Sometimes, a rapid strep test is done. Other lab tests may also be done, depending on the suspected cause.  °TREATMENT  °Viral pharyngitis will usually get better in 3 4 days without the use of medicine. Bacterial pharyngitis is treated with medicines that kill germs (antibiotics).  °HOME CARE INSTRUCTIONS  °· Drink enough water and fluids to keep your urine clear or pale yellow.   °· Only take over-the-counter or prescription medicines as directed by your health care provider:   °· If you are prescribed antibiotics, make sure you finish them even if you start to feel better.   °· Do not take aspirin.   °· Get lots of rest.   °· Gargle with 8 oz of salt water (½ tsp of salt per 1 qt of water) as often as every 1 2 hours to soothe your throat.   °· Throat lozenges (if you are not at risk for choking) or sprays may be used to soothe your throat. °SEEK MEDICAL  CARE IF:  °· You have large, tender lumps in your neck. °· You have a rash. °· You cough up green, yellow-brown, or bloody spit. °SEEK IMMEDIATE MEDICAL CARE IF:  °· Your neck becomes stiff. °· You drool or are unable to swallow liquids. °· You vomit or are unable to keep medicines or liquids down. °· You have severe pain that does not go away with the use of recommended medicines. °· You have trouble breathing (not caused by a stuffy nose). °MAKE SURE YOU:  °· Understand these instructions. °· Will watch your condition. °· Will get help right away if you are not doing well or get worse. °Document Released: 04/23/2005 Document Revised: 02/11/2013 Document Reviewed: 12/29/2012 °[employer]® Patient Information ©2014 [employer]. ° °

## 2013-07-22 NOTE — ED Provider Notes (Signed)
CSN: [id number]     Arrival date & time 07/22/13  9562 History   First MD Initiated Contact with Patient 07/22/13 [phone]     Chief Complaint  Patient presents with  . Sore Throat     (Consider location/radiation/quality/duration/timing/severity/associated sxs/prior Treatment) HPI  This is a 74 yom with a history of substance abuse who presents with sore throat.  Patient reports onset of sore throat 2 days ago. He reports dysphasia. He denies any difficulty breathing. Patient reports 10 out of 10 pain. He denies any fevers, congestion, chills, shortness of breath, or chest pain. He is unable to eat his breakfast this morning secondary to pain. Positive sick contacts. Currently at [facility] for alcohol rehabilitation.  Past Medical History  Diagnosis Date  . GERD (gastroesophageal reflux disease)   . Depression   . Anxiety   . Substance abuse    Past Surgical History  Procedure Laterality Date  . Cholecystectomy N/A 09/21/2012    Procedure: LAPAROSCOPIC CHOLECYSTECTOMY WITH INTRAOPERATIVE CHOLANGIOGRAM;  Surgeon: Robyne Askew, MD;  Location: [facility] OR;  Service: General;  Laterality: N/A;   No family history on file. History  Substance Use Topics  . Smoking status: Current Every Day Smoker    Types: Cigarettes  . Smokeless tobacco: Not on file  . Alcohol Use: Yes     Comment: last deibk 70 days ago    Review of Systems  Constitutional: Negative.  Negative for fever and chills.  HENT: Positive for sore throat.   Respiratory: Negative.  Negative for chest tightness and shortness of breath.   Cardiovascular: Negative.  Negative for chest pain.  Gastrointestinal: Negative.  Negative for abdominal pain.  Genitourinary: Negative.   Skin: Negative for rash.  All other systems reviewed and are negative.      Allergies  Review of patient's allergies indicates no known allergies.  Home Medications   Current Outpatient Rx  Name  Route  Sig  Dispense  Refill  .  chlorpheniramine-HYDROcodone (TUSSIONEX) 10-8 MG/5ML LQCR   Oral   Take 5 mLs by mouth every 12 (twelve) hours as needed for cough.   115 mL   0   . levofloxacin (LEVAQUIN) 500 MG tablet   Oral   Take 1 tablet (500 mg total) by mouth daily.   7 tablet   0   . QUEtiapine (SEROQUEL) 25 MG tablet   Oral   Take 1 tablet (25 mg total) by mouth 3 (three) times daily.   90 tablet   0   . QUEtiapine (SEROQUEL) 50 MG tablet   Oral   Take 1 tablet (50 mg total) by mouth at bedtime.   30 tablet   0   . sertraline (ZOLOFT) 25 MG tablet   Oral   Take 1 tablet (25 mg total) by mouth daily.   30 tablet   0   . traZODone (DESYREL) 100 MG tablet   Oral   Take 1 tablet (100 mg total) by mouth at bedtime as needed and may repeat dose one time if needed for sleep.   30 tablet   0    BP 115/79  Pulse 90  Temp(Src) 97.7 F (36.5 C) (Oral)  Resp 16  Ht 5\' 6"  (1.676 m)  Wt 170 lb (77.111 kg)  BMI 27.45 kg/m2  SpO2 97% Physical Exam  Nursing note and vitals reviewed. Constitutional: He is oriented to person, place, and time. He appears well-developed and well-nourished. No distress.  HENT:  Head: Normocephalic and atraumatic.  Mouth/Throat: No oropharyngeal exudate.  Bilateral symmetric enlargement of the tonsils, no exudate noted, mild erythema, no uvular deviation  Eyes: Pupils are equal, round, and reactive to light.  Neck: Neck supple.  Cardiovascular: Normal rate, regular rhythm and normal heart sounds.   No murmur heard. Pulmonary/Chest: Effort normal and breath sounds normal. No respiratory distress.  Musculoskeletal: He exhibits no edema.  Lymphadenopathy:    He has cervical adenopathy.  Neurological: He is alert and oriented to person, place, and time.  Skin: Skin is warm and dry.  Psychiatric: He has a normal mood and affect.    ED Course  Procedures (including critical care time) Labs Review Labs Reviewed  RAPID STREP SCREEN   Imaging Review No results  found.   EKG Interpretation None      MDM   Final diagnoses:  Viral pharyngitis    Patient presents with sore throat. Nontoxic-appearing. Normal voice and phonation. Afebrile. Strep screen negative. Patient given IM Decadron for symmetric tonsillar swelling.  Likely viral pharyngitis. Patient is in rehabilitation and does not want any pain medication other than ibuprofen and Tylenol. He was able to tolerate fluids prior to discharge. Patient was given strict return precautions.  After history, exam, and medical workup I feel the patient has been appropriately medically screened and is safe for discharge home. Pertinent diagnoses were discussed with the patient. Patient was given return precautions.     Shon Batonourtney F Horton, MD 07/22/13 [phone]

## 2013-07-24 LAB — CULTURE, GROUP A STREP

## 2015-07-10 IMAGING — CR DG CHEST 2V
2 series · 2 of 2 positions shown · non-contrast
Comparison: 12/02/2012 and earlier.

CLINICAL DATA: 47-year-old male with cough. Initial encounter.

EXAM:
CHEST  2 VIEW

[w chest pa]
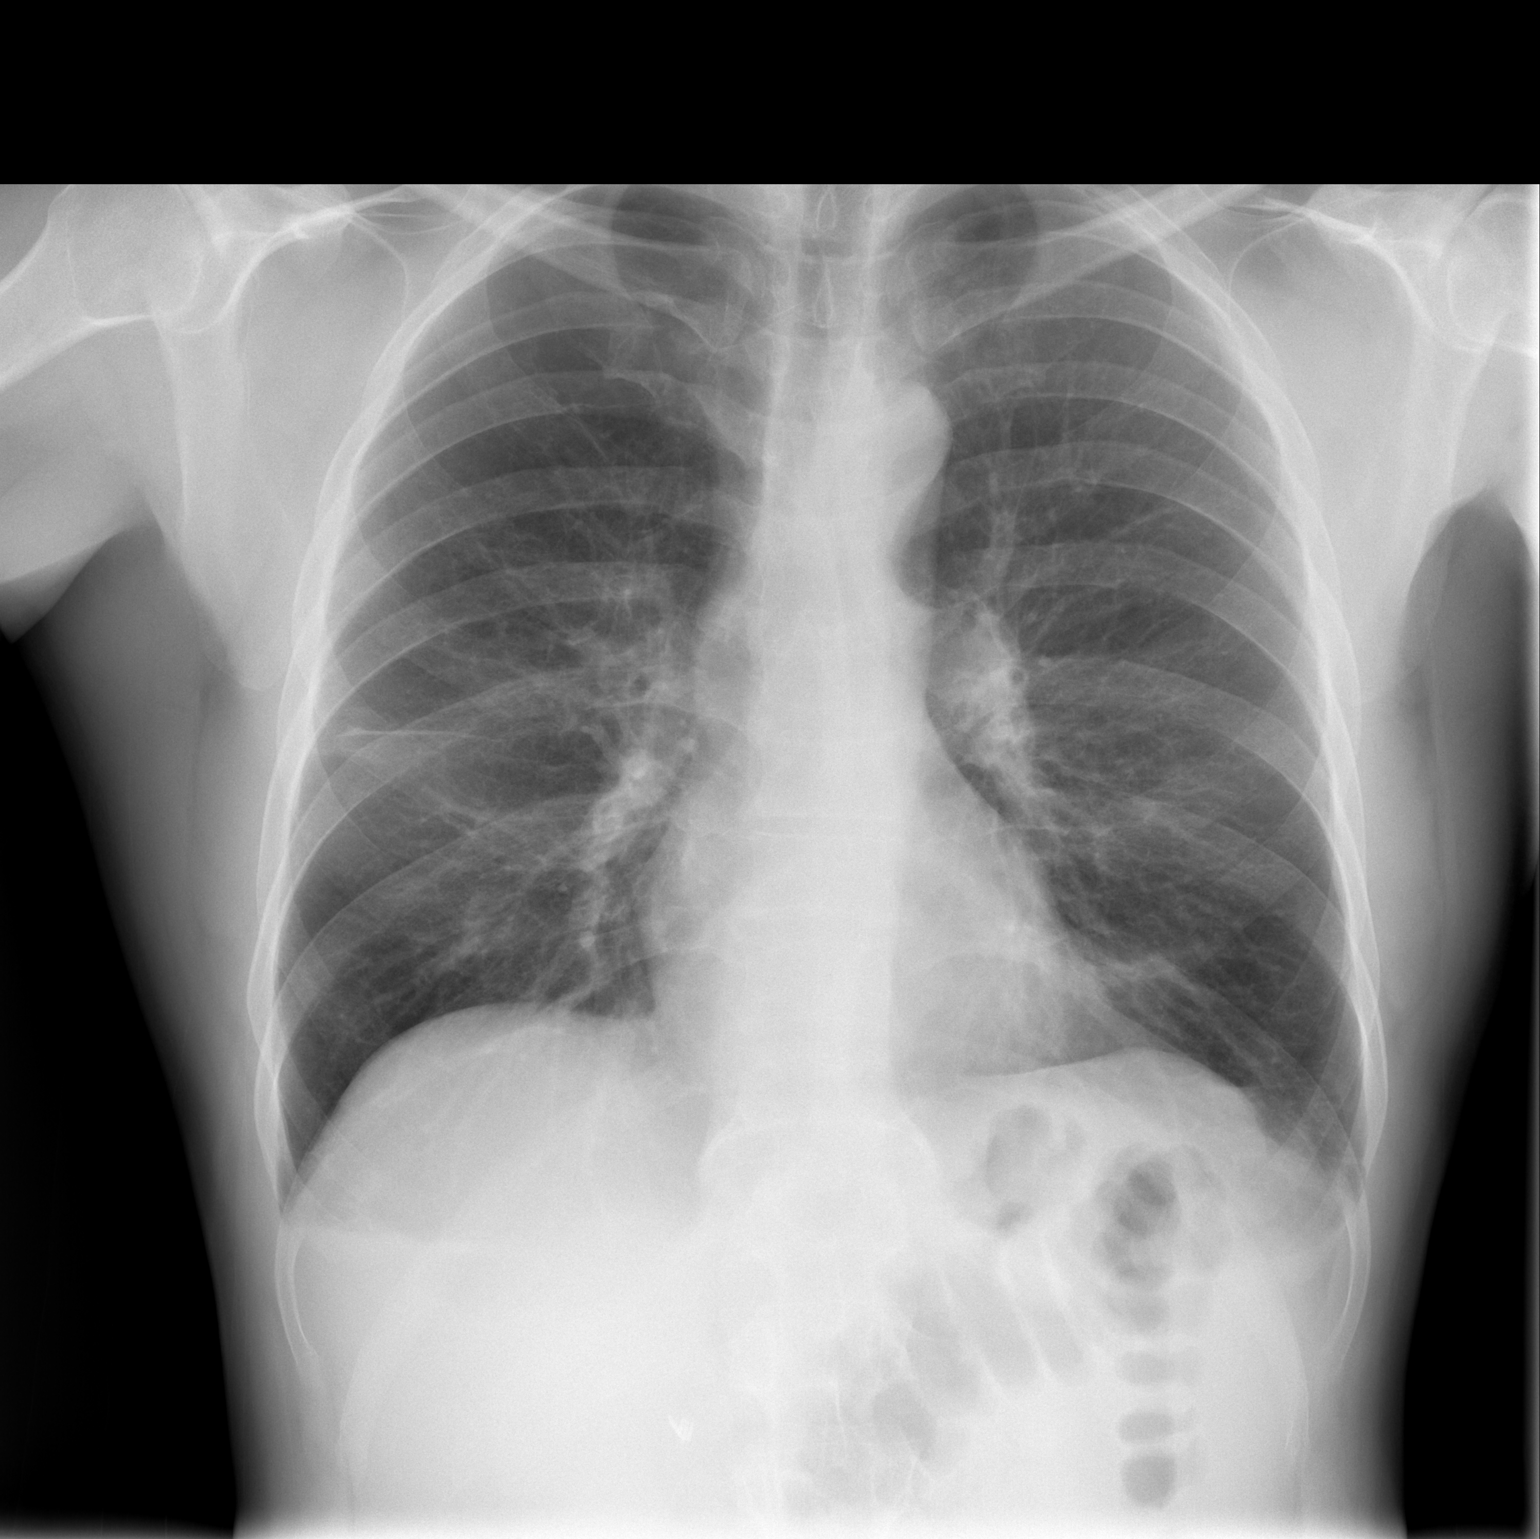

[w chest lat]
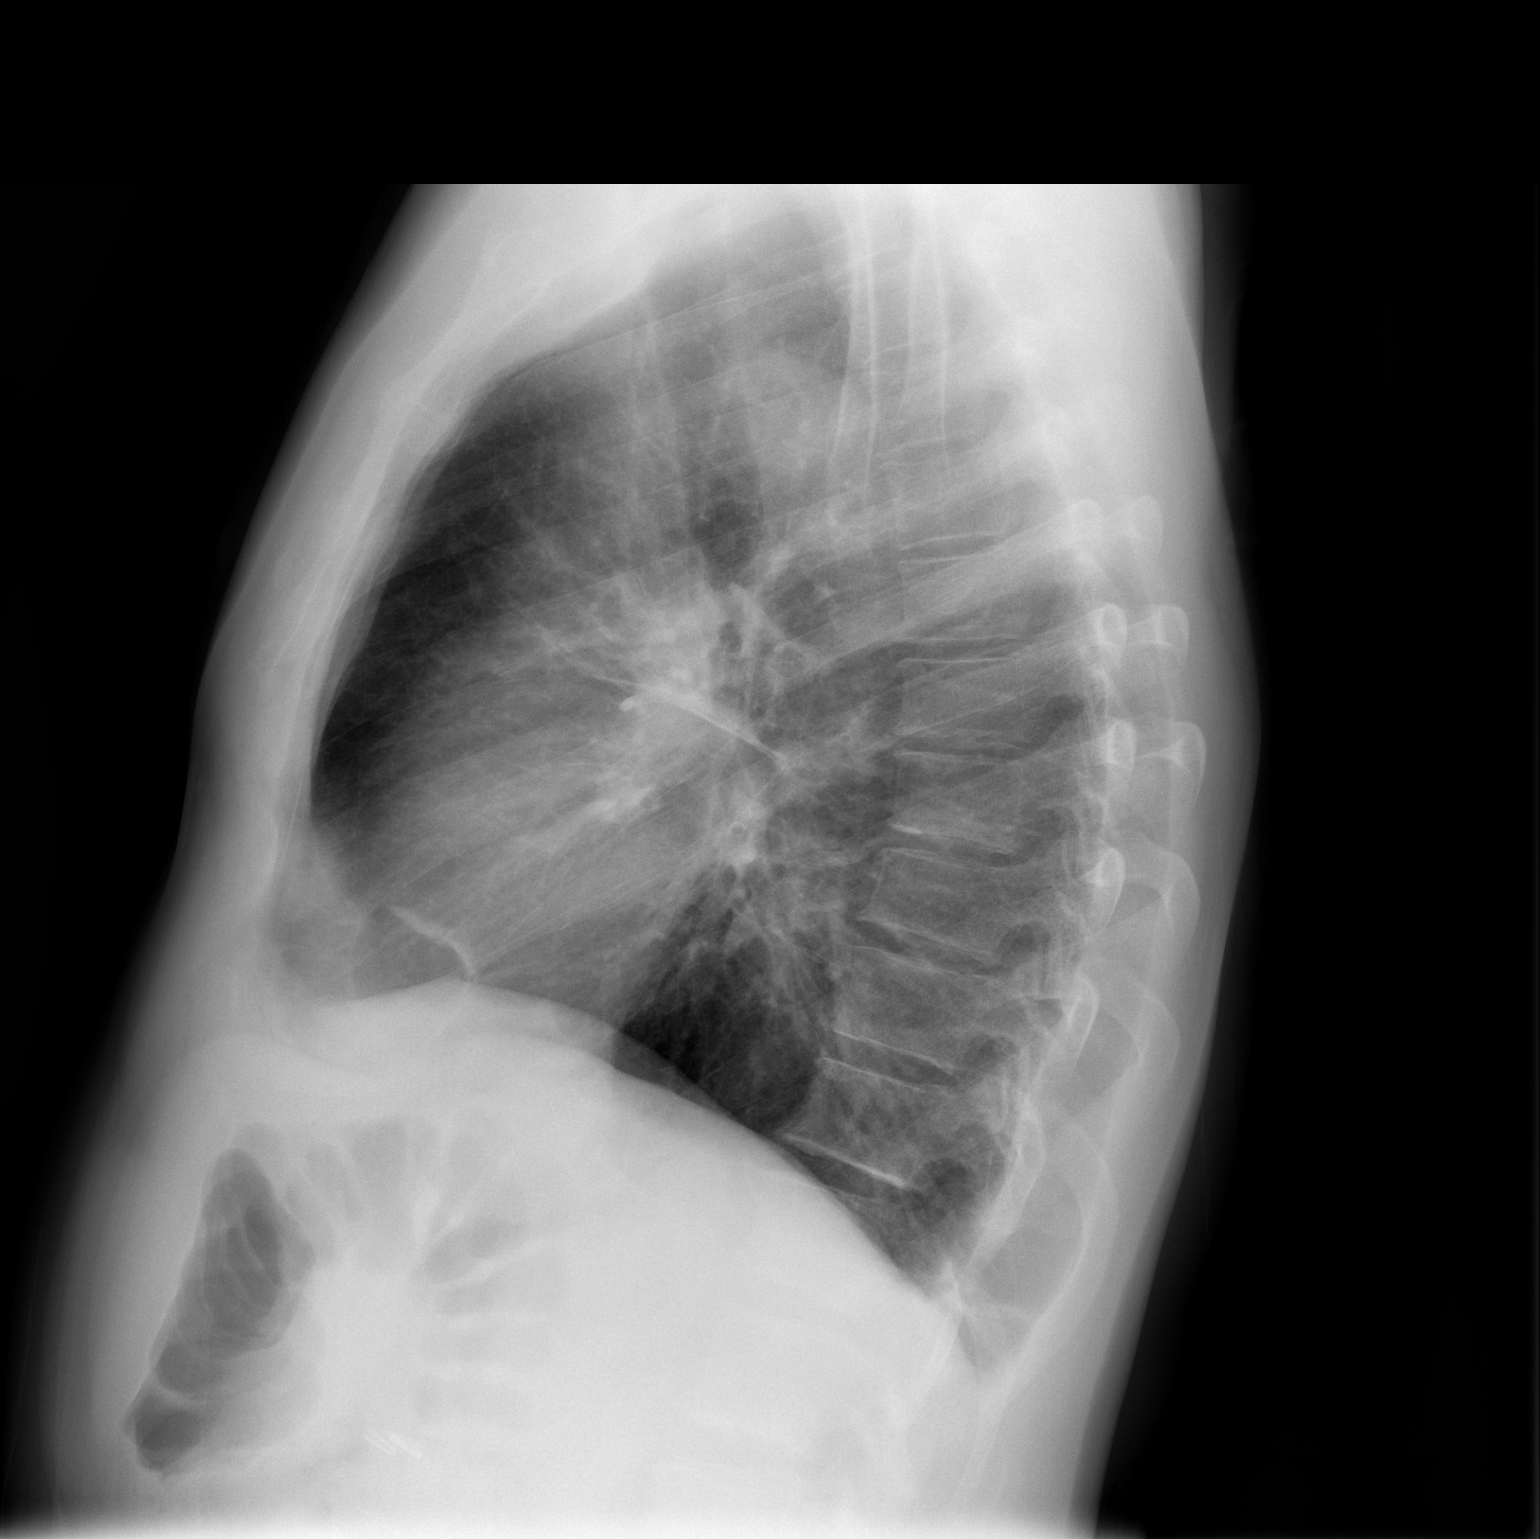

[2 of 2 positions shown; findings below may reference images not displayed]

FINDINGS: Mildly lower lung volumes on the frontal view. Stable cardiac size
and mediastinal contours. Visualized tracheal air column is within
normal limits. No pneumothorax. No pulmonary edema or pleural
effusion. No confluent pulmonary opacity. Mild bilateral increased
interstitial markings are not significantly changed. Trace fluid or
thickening in the right minor fissure today. There is streaky
increased opacity in the lingula. No acute osseous abnormality
identified. Stable right upper quadrant surgical clips.
IMPRESSION: 1. Streaky increased opacity in the lingula suspicious for
bronchopneumonia in this setting, or less likely simple atelectasis.
2. Otherwise stable chest.

## 2017-08-04 ENCOUNTER — Emergency Department (HOSPITAL_COMMUNITY)
Admission: EM | Admit: 2017-08-04 | Discharge: 2017-08-05 | Disposition: A | Discharge status: Home / Self Care | Payer: Self-pay | Attending: Emergency Medicine | Admitting: Emergency Medicine

## 2017-08-04 ENCOUNTER — Encounter (HOSPITAL_COMMUNITY): Payer: Self-pay | Admitting: Emergency Medicine

## 2017-08-04 DIAGNOSIS — Z79899 Other long term (current) drug therapy: Secondary | ICD-10-CM | POA: Insufficient documentation

## 2017-08-04 DIAGNOSIS — F17210 Nicotine dependence, cigarettes, uncomplicated: Secondary | ICD-10-CM | POA: Insufficient documentation

## 2017-08-04 DIAGNOSIS — F333 Major depressive disorder, recurrent, severe with psychotic symptoms: Secondary | ICD-10-CM | POA: Diagnosis present

## 2017-08-04 DIAGNOSIS — F1010 Alcohol abuse, uncomplicated: Secondary | ICD-10-CM | POA: Insufficient documentation

## 2017-08-04 LAB — COMPREHENSIVE METABOLIC PANEL
ALT: 16 U/L — ABNORMAL LOW (ref 17–63)
AST: 23 U/L (ref 15–41)
Albumin: 4.2 g/dL (ref 3.5–5.0)
Alkaline Phosphatase: 87 U/L (ref 38–126)
Anion gap: 13 (ref 5–15)
BUN: 13 mg/dL (ref 6–20)
CHLORIDE: 102 mmol/L (ref 101–111)
CO2: 24 mmol/L (ref 22–32)
Calcium: 9.9 mg/dL (ref 8.9–10.3)
Creatinine, Ser: 1.3 mg/dL — ABNORMAL HIGH (ref 0.61–1.24)
GFR calc Af Amer: >60 mL/min (ref >60)
GFR calc non Af Amer: >60 mL/min (ref >60)
Glucose, Bld: 155 mg/dL — ABNORMAL HIGH (ref 65–99)
Potassium: 3.9 mmol/L (ref 3.5–5.1)
SODIUM: 139 mmol/L (ref 135–145)
Total Bilirubin: 1.5 mg/dL — ABNORMAL HIGH (ref 0.3–1.2)
Total Protein: 8.5 g/dL — ABNORMAL HIGH (ref 6.5–8.1)

## 2017-08-04 LAB — CBC
HCT: 52.3 % — ABNORMAL HIGH (ref 39.0–52.0)
Hemoglobin: 18 g/dL — ABNORMAL HIGH (ref 13.0–17.0)
MCH: 31.7 pg (ref 26.0–34.0)
MCHC: 34.4 g/dL (ref 30.0–36.0)
MCV: 92.1 fL (ref 78.0–100.0)
PLATELETS: 317 10*3/uL (ref 150–400)
RBC: 5.68 MIL/uL (ref 4.22–5.81)
RDW: 13.2 % (ref 11.5–15.5)
WBC: 11 10*3/uL — ABNORMAL HIGH (ref 4.0–10.5)

## 2017-08-04 LAB — ETHANOL

## 2017-08-04 MED ORDER — ZOLPIDEM TARTRATE 5 MG PO TABS: 5.0000 mg | ORAL_TABLET | Freq: Every evening | ORAL | Status: DC | PRN

## 2017-08-04 MED ORDER — THIAMINE HCL 100 MG/ML IJ SOLN
100.0000 mg | Freq: Every day | INTRAMUSCULAR | Status: DC
  Administered 2017-08-04: 100 mg via INTRAVENOUS
  Filled 2017-08-04: qty 2

## 2017-08-04 MED ORDER — TRAZODONE HCL 100 MG PO TABS: 100.0000 mg | ORAL_TABLET | Freq: Every evening | ORAL | Status: DC | PRN

## 2017-08-04 MED ORDER — LORAZEPAM 2 MG/ML IJ SOLN: 0.0000 mg | Freq: Two times a day (BID) | INTRAMUSCULAR | Status: DC

## 2017-08-04 MED ORDER — SERTRALINE HCL 50 MG PO TABS
25.0000 mg | ORAL_TABLET | Freq: Every day | ORAL | Status: DC
  Administered 2017-08-05: 25 mg via ORAL
  Filled 2017-08-04: qty 1

## 2017-08-04 MED ORDER — LORAZEPAM 2 MG/ML IJ SOLN
0.0000 mg | Freq: Four times a day (QID) | INTRAMUSCULAR | Status: DC
  Administered 2017-08-04 – 2017-08-05 (×2): 2 mg via INTRAVENOUS
  Filled 2017-08-04 (×2): qty 1

## 2017-08-04 MED ORDER — QUETIAPINE FUMARATE 50 MG PO TABS
50.0000 mg | ORAL_TABLET | Freq: Every day | ORAL | Status: DC
Start: 2017-08-04 — End: 2017-08-05

## 2017-08-04 MED ORDER — LORAZEPAM 1 MG PO TABS: 0.0000 mg | ORAL_TABLET | Freq: Two times a day (BID) | ORAL | Status: DC

## 2017-08-04 MED ORDER — LORAZEPAM 1 MG PO TABS
0.0000 mg | ORAL_TABLET | Freq: Four times a day (QID) | ORAL | Status: DC
  Administered 2017-08-05: 2 mg via ORAL
  Filled 2017-08-04: qty 2

## 2017-08-04 MED ORDER — VITAMIN B-1 100 MG PO TABS
100.0000 mg | ORAL_TABLET | Freq: Every day | ORAL | Status: DC
  Administered 2017-08-05: 100 mg via ORAL
  Filled 2017-08-04: qty 1

## 2017-08-04 MED ORDER — SODIUM CHLORIDE 0.9 % IV BOLUS
1000.0000 mL | Freq: Once | INTRAVENOUS | Status: AC
  Administered 2017-08-04: 1000 mL via INTRAVENOUS

## 2017-08-04 MED ORDER — IBUPROFEN 200 MG PO TABS: 600.0000 mg | ORAL_TABLET | Freq: Three times a day (TID) | ORAL | Status: DC | PRN

## 2017-08-04 MED ORDER — ONDANSETRON HCL 4 MG PO TABS
4.0000 mg | ORAL_TABLET | Freq: Three times a day (TID) | ORAL | Status: DC | PRN
  Administered 2017-08-04 – 2017-08-05 (×2): 4 mg via ORAL
  Filled 2017-08-04 (×2): qty 1

## 2017-08-04 NOTE — ED Notes (Addendum)
Patient resting in bed and is difficult to wake up. Pt continues to wear heart monitor for safety. No distress noted; respirations regular and unlabored.

## 2017-08-04 NOTE — ED Triage Notes (Signed)
Pt states he is trying to stop drinking and has not drank since Monday. States he generally has about 12 beers a day. Has been throwing up ever since and has not been able to eat or drink. States he is shaky and is having auditory hallucinations. Anxiety is present.

## 2017-08-04 NOTE — ED Provider Notes (Signed)
[facility]-EMERGENCY DEPT Provider Note   CSN: [id number] Arrival date & time: 08/04/17  1615     History   Chief Complaint Chief Complaint  Patient presents with  . Nausea  . Emesis  . Alcohol Problem    HPI Faisal Stradling is a 52 y.o. male.  HPI  Patient with history of alcohol abuse presents after trying to quit drinking.  He states his last drink was 5 days ago on Tuesday.  He has had decreased appetite and over the past couple days has been having nausea and vomiting.  He has not had any seizures and is no longer having tremors.  He is presenting to the ED requesting alcohol detox.  He states he has tried to quit multiple times on his own and has never been able to do it.  He does not endorse current suicidal ideations.  There are no other associated systemic symptoms, there are no other alleviating or modifying factors.   Past Medical History:  Diagnosis Date  . Anxiety   . Depression   . GERD (gastroesophageal reflux disease)   . Substance abuse [facility])     Patient Active Problem List   Diagnosis Date Noted  . Major depressive disorder, recurrent episode, severe, specified as with psychotic behavior 05/23/2013  . Anxiety state, unspecified 05/23/2013  . Alcohol dependence (HCC) 05/23/2013  . Alcohol withdrawal (HCC) 05/23/2013  . Cocaine abuse (HCC) 05/23/2013  . Opiate dependence (HCC) 05/23/2013    Past Surgical History:  Procedure Laterality Date  . CHOLECYSTECTOMY N/A 09/21/2012   Procedure: LAPAROSCOPIC CHOLECYSTECTOMY WITH INTRAOPERATIVE CHOLANGIOGRAM;  Surgeon: Robyne Askew, MD;  Location: [facility] OR;  Service: General;  Laterality: N/A;        Home Medications    Prior to Admission medications   Medication Sig Start Date End Date Taking? Authorizing Provider  chlorpheniramine-HYDROcodone (TUSSIONEX) 10-8 MG/5ML LQCR Take 5 mLs by mouth every 12 (twelve) hours as needed for cough. 06/29/13   Gerhard Munch, MD  levofloxacin  (LEVAQUIN) 500 MG tablet Take 1 tablet (500 mg total) by mouth daily. 06/29/13   Gerhard Munch, MD  QUEtiapine (SEROQUEL) 25 MG tablet Take 1 tablet (25 mg total) by mouth 3 (three) times daily. 05/31/13   Truman Hayward, FNP  QUEtiapine (SEROQUEL) 50 MG tablet Take 1 tablet (50 mg total) by mouth at bedtime. 05/31/13   Truman Hayward, FNP  sertraline (ZOLOFT) 25 MG tablet Take 1 tablet (25 mg total) by mouth daily. 05/31/13   Truman Hayward, FNP  traZODone (DESYREL) 100 MG tablet Take 1 tablet (100 mg total) by mouth at bedtime as needed and may repeat dose one time if needed for sleep. 05/31/13   Truman Hayward, FNP    Family History History reviewed. No pertinent family history.  Social History Social History   Tobacco Use  . Smoking status: Current Every Day Smoker    Types: Cigarettes  Substance Use Topics  . Alcohol use: Yes    Comment: last drink 6 days ago  . Drug use: Yes    Types: Marijuana, "Crack" cocaine    Comment: Past hx of cocaine--last used 70 days ago     Allergies   Patient has no known allergies.   Review of Systems Review of Systems  ROS reviewed and all otherwise negative except for mentioned in HPI   Physical Exam Updated Vital Signs BP 112/82 (BP Location: Right Arm)   Pulse 83   Temp 98.1 F (  36.7 C) (Axillary)   Resp (!) 26   SpO2 98%   Vitals reviewed vPhysical Exam  Physical Examination: General appearance - alert, well appearing, and in no distress, tearful Mental status - alert, oriented to person, place, and time Eyes -no conjunctival injection, no scleral icterus Mouth - mucous membranes moist, pharynx normal without lesions Neck - supple, no significant adenopathy Chest - clear to auscultation, no wheezes, rales or rhonchi, symmetric air entry Heart - normal rate, regular rhythm, normal S1, S2, no murmurs, rubs, clicks or gallops Abdomen - soft, nontender, nondistended, no masses or organomegaly Neurological - alert, oriented  x 3, normal speech, no tremor Extremities - peripheral pulses normal, no pedal edema, no clubbing or cyanosis Skin - normal coloration and turgor, no rashes Psych- tearful, calm, cooperative, answering questions   ED Treatments / Results  Labs (all labs ordered are listed, but only abnormal results are displayed) Labs Reviewed  COMPREHENSIVE METABOLIC PANEL - Abnormal; Notable for the following components:      Result Value   Glucose, Bld 155 (*)    Creatinine, Ser 1.30 (*)    Total Protein 8.5 (*)    ALT 16 (*)    Total Bilirubin 1.5 (*)    All other components within normal limits  CBC - Abnormal; Notable for the following components:   WBC 11.0 (*)    Hemoglobin 18.0 (*)    HCT 52.3 (*)    All other components within normal limits  ETHANOL  RAPID URINE DRUG SCREEN, HOSP PERFORMED    EKG EKG Interpretation  Date/Time:  Sunday August 04 2017 18:35:21 EDT Ventricular Rate:  105 PR Interval:    QRS Duration: 93 QT Interval:  346 QTC Calculation: 458 R Axis:   82 Text Interpretation:  Sinus tachycardia Borderline T abnormalities, inferior leads Borderline ST elevation, anterior leads No significant change since last tracing Confirmed by Jerelyn ScottLinker, Martha [phone]([id number]) on 08/04/2017 6:46:40 PM   Radiology No results found.  Procedures Procedures (including critical care time)  Medications Ordered in ED Medications  LORazepam (ATIVAN) injection 0-4 mg (2 mg Intravenous Given 08/04/17 1852)    Or  LORazepam (ATIVAN) tablet 0-4 mg ( Oral See Alternative 08/04/17 1852)  LORazepam (ATIVAN) injection 0-4 mg (has no administration in time range)    Or  LORazepam (ATIVAN) tablet 0-4 mg (has no administration in time range)  thiamine (VITAMIN B-1) tablet 100 mg ( Oral See Alternative 08/04/17 1908)    Or  thiamine (B-1) injection 100 mg (100 mg Intravenous Given 08/04/17 1908)  ibuprofen (ADVIL,MOTRIN) tablet 600 mg (has no administration in time range)  zolpidem (AMBIEN) tablet 5 mg  (has no administration in time range)  ondansetron (ZOFRAN) tablet 4 mg (4 mg Oral Given 08/04/17 1848)  sodium chloride 0.9 % bolus 1,000 mL (0 mLs Intravenous Stopped 08/04/17 2012)     Initial Impression / Assessment and Plan / ED Course  I have reviewed the triage vital signs and the nursing notes.  Pertinent labs & imaging results that were available during my care of the patient were reviewed by me and considered in my medical decision making (see chart for details).   wlll give patient fluid bolus, zofran ODT for his symptoms, he has been started on CIWA protocol.  TTS consulted.    Pt is medically cleared, his vitals are improved, able to tolerate po fluids after zofran.  Awaiting TTS consult to determine disposition.  Psych holding orders written.   Final Clinical Impressions(s) /  ED Diagnoses   Final diagnoses:  Alcohol abuse    ED Discharge Orders    None       Phineas Real Latanya Maudlin, MD 08/04/17 2321

## 2017-08-04 NOTE — ED Notes (Signed)
Pt's sister Parker Berg called to get further information on the patient. Pt asleep and unable to consent at this time. No information given; Clinical research associatewriter encouraged caller to visit at her earliest convenience.

## 2017-08-04 NOTE — ED Notes (Signed)
Pt currently asleep with no signs of distress noted. Sitter at bedside for safety.

## 2017-08-04 NOTE — BHH Counselor (Signed)
Clinician attempted to engage pt in the TTS consult however pt did not respond. Clinician asked RN to inform her once pt is alert/roused to engage in assessment. Clinician updated Dr. Phineas RealMabe of disposition.  Redmond Pullingreylese D Dim Meisinger, MS, [facility], [facility] Triage Specialist [phone]

## 2017-08-05 DIAGNOSIS — F333 Major depressive disorder, recurrent, severe with psychotic symptoms: Secondary | ICD-10-CM

## 2017-08-05 DIAGNOSIS — R4587 Impulsiveness: Secondary | ICD-10-CM

## 2017-08-05 DIAGNOSIS — F1010 Alcohol abuse, uncomplicated: Secondary | ICD-10-CM
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
## 2017-08-05 NOTE — BHH Suicide Risk Assessment (Deleted)
Suicide Risk Assessment  Discharge Assessment   [facility]   Principal Problem: Severe episode of recurrent major depressive disorder, with psychotic features [facility](HCC) Discharge Diagnoses:  Patient Active Problem List   Diagnosis Date Noted  . Alcohol abuse [F10.10]   . Severe episode of recurrent major depressive disorder, with psychotic features (HCC) [F33.3] 05/23/2013  . Anxiety state, unspecified [F41.1] 05/23/2013  . Alcohol dependence (HCC) [F10.20] 05/23/2013  . Alcohol withdrawal (HCC) [F10.239] 05/23/2013  . Cocaine abuse (HCC) [F14.10] 05/23/2013  . Opiate dependence (HCC) [F11.20] 05/23/2013   Pt was seen and chart reviewed with treatment team. [facility] Pt stated he hears voices but he has heard them for years and they are not command voices or frightening.  Pt denies suicidal/homicidal ideation, denies visual hallucinations and does not appear to be responding to internal stimuli. Pt is psychiatrically clear for discharge.  Total Time spent with patient: 30 minutes  Musculoskeletal: Strength & Muscle Tone: within normal limits Gait & Station: normal Patient leans: N/A  Psychiatric Specialty Exam:   Blood pressure 108/71, pulse 70, temperature 98.6 F (37 C), temperature source Oral, resp. rate (!) 24, SpO2 94 %.There is no height or weight on file to calculate BMI.  General Appearance: Disheveled  Eye SolicitorContact::  Fair  Speech:  Clear and Coherent409  Volume:  Decreased  Mood:  Depressed  Affect:  Congruent and Depressed  Thought Process:  Coherent and Linear  Orientation:  Full (Time, Place, and Person)  Thought Content:  Logical  Suicidal Thoughts:  No  Homicidal Thoughts:  No  Memory:  Immediate;   Good Recent;   Good Remote;   Fair  Judgement:  Impaired  Insight:  Lacking  Psychomotor Activity:  Decreased  Concentration:  Fair  Recall:  FiservFair  Fund of Knowledge:Good  Language: Good  Akathisia:   No  Handed:  Right  AIMS (if indicated):     Assets:  Communication Skills  Sleep:     Cognition: WNL  ADL's:  Intact   Mental Status Per Nursing Assessment::   On Admission:     Demographic Factors:  Male and Caucasian  Loss Factors: Financial problems/change in socioeconomic status  Historical Factors: Impulsivity  Risk Reduction Factors:   Sense of responsibility to family  Continued Clinical Symptoms:  Depression:   Impulsivity Alcohol/Substance Abuse/Dependencies  Cognitive Features That Contribute To Risk:  Closed-mindedness    Suicide Risk:  Minimal: No identifiable suicidal ideation.  Patients presenting with no risk factors but with morbid ruminations; may be classified as minimal risk based on the severity of the depressive symptoms    Plan Of Care/Follow-up recommendations:  Activity:  as tolerated  Diet:  Heart Healthy  Laveda AbbeLaurie Britton Dorri Ozturk, NP 08/05/2017, 11:36 AM

## 2017-08-05 NOTE — ED Notes (Signed)
Sister called and wants the MD to know the issues he has been through: father died when he was 8, Watched his best friend die, his mother died and had 3 children die. The way he deals with these trauma is to drink.

## 2017-08-05 NOTE — ED Notes (Signed)
Patient verbalized that is was ok that his sister can have information about him and be involved with his care.

## 2017-08-05 NOTE — Discharge Instructions (Signed)
To help you maintain a sober lifestyle, a substance abuse treatment program may be beneficial to you.  Contact [facility] at your earliest opportunity to ask about enrolling in their program: ° °     [facility] ([facility] °     [address] °     [place],  [address] °     [phone] °     New patients are seen at the walk-in clinic every Tuesday from 9:00 am - 12:00 pm °

## 2017-08-05 NOTE — BHH Counselor (Deleted)
Clini

## 2017-08-05 NOTE — BHH Suicide Risk Assessment (Signed)
Suicide Risk Assessment  Discharge Assessment   [facility]   Principal Problem: Severe episode of recurrent major depressive disorder, with psychotic features [facility](HCC) Discharge Diagnoses:  Patient Active Problem List   Diagnosis Date Noted  . Alcohol abuse [F10.10]   . Severe episode of recurrent major depressive disorder, with psychotic features (HCC) [F33.3] 05/23/2013  . Anxiety state, unspecified [F41.1] 05/23/2013  . Alcohol dependence (HCC) [F10.20] 05/23/2013  . Alcohol withdrawal (HCC) [F10.239] 05/23/2013  . Cocaine abuse (HCC) [F14.10] 05/23/2013  . Opiate dependence (HCC) [F11.20] 05/23/2013   Pt was seen and chart reviewed with treatment team and Dr Sharma CovertNorman. Pt has a long history of alcoholism and stated he felt like his body was shutting down. Pt stated his last drink was on 07-30-2017 and he wants to get into a rehab program for treatment of his alcoholism. Pt does not appear to be displaying any signs or symptoms of withdrawal. Pt was able to eat breakfast today and has had not N/V/D or tremors today. Pt is psychiatrically clear for discharge and follow up with Peer Support in the community.   Total Time spent with patient: 30 minutes  Musculoskeletal: Strength & Muscle Tone: within normal limits Gait & Station: normal Patient leans: N/A   Psychiatric Specialty Exam:   Blood pressure 108/71, pulse 70, temperature 98.6 F (37 C), temperature source Oral, resp. rate (!) 24, SpO2 94 %.There is no height or weight on file to calculate BMI.  General Appearance: Disheveled  Eye SolicitorContact::  Fair  Speech:  Clear and Coherent409  Volume:  Decreased  Mood:  Depressed  Affect:  Congruent and Depressed  Thought Process:  Coherent and Linear  Orientation:  Full (Time, Place, and Person)  Thought Content:  Logical  Suicidal Thoughts:  No  Homicidal Thoughts:  No  Memory:  Immediate;   Good Recent;   Good Remote;   Fair  Judgement:  Impaired   Insight:  Lacking  Psychomotor Activity:  Decreased  Concentration:  Fair  Recall:  FiservFair  Fund of Knowledge:Good  Language: Good  Akathisia:  No  Handed:  Right  AIMS (if indicated):     Assets:  Communication Skills  Sleep:     Cognition: WNL  ADL's:  Intact      Mental Status Per Nursing Assessment::   On Admission:   Intoxicated  Demographic Factors:  Male and Low socioeconomic status  Loss Factors: Financial problems/change in socioeconomic status  Historical Factors: Impulsivity  Risk Reduction Factors:   Sense of responsibility to family  Continued Clinical Symptoms:  Alcohol/Substance Abuse/Dependencies  Cognitive Features That Contribute To Risk:  Closed-mindedness    Suicide Risk:  Minimal: No identifiable suicidal ideation.  Patients presenting with no risk factors but with morbid ruminations; may be classified as minimal risk based on the severity of the depressive symptoms    Plan Of Care/Follow-up recommendations:  Activity:  as tolerated Diet:  Heart healthy  Laveda AbbeLaurie Britton Damiel Barthold, NP 08/05/2017, 11:40 AM

## 2017-08-05 NOTE — BH Assessment (Signed)
B[facility] Assessment Progress Note  Per Jacqueline Norman, DO, this pt does not require psychiatric hospitalization at this time.  Pt is to be discharged from [facility] with recommendation to follow up with [facility].  This has been included in pt's discharge instructions.  Pt would also benefit from seeing Peer Support Specialists; they will be asked to speak to pt.  Pt's nurse, Randi, has been notified.  Parker Dennen, MA Triage Specialist [phone]     

## 2018-12-03 ENCOUNTER — Encounter (HOSPITAL_COMMUNITY): Payer: Self-pay | Admitting: Family Medicine

## 2018-12-03 DIAGNOSIS — Z5321 Procedure and treatment not carried out due to patient leaving prior to being seen by health care provider: Secondary | ICD-10-CM | POA: Insufficient documentation

## 2018-12-03 DIAGNOSIS — F10929 Alcohol use, unspecified with intoxication, unspecified: Secondary | ICD-10-CM | POA: Insufficient documentation

## 2018-12-03 MED ORDER — SODIUM CHLORIDE (PF) 0.9 % IJ SOLN
INTRAMUSCULAR | Status: AC
  Filled 2018-12-03: qty 50

## 2018-12-03 NOTE — ED Triage Notes (Signed)
Patient was picked up from a hotel and transported via [facility]. Patient states he usually drinks hard liquor, but has decreased amount in the last 10 days. He is wanting detox and feeling lightheaded.

## 2018-12-04 ENCOUNTER — Emergency Department (HOSPITAL_COMMUNITY)
Admission: EM | Admit: 2018-12-04 | Discharge: 2018-12-04 | Disposition: A | Discharge status: Home / Self Care | Payer: Self-pay | Attending: Emergency Medicine | Admitting: Emergency Medicine

## 2018-12-04 ENCOUNTER — Encounter (HOSPITAL_COMMUNITY): Payer: Self-pay | Admitting: Emergency Medicine

## 2018-12-04 ENCOUNTER — Other Ambulatory Visit: Payer: Self-pay

## 2018-12-04 DIAGNOSIS — F17210 Nicotine dependence, cigarettes, uncomplicated: Secondary | ICD-10-CM | POA: Insufficient documentation

## 2018-12-04 DIAGNOSIS — F1910 Other psychoactive substance abuse, uncomplicated: Secondary | ICD-10-CM | POA: Insufficient documentation

## 2018-12-04 DIAGNOSIS — F10930 Alcohol use, unspecified with withdrawal, uncomplicated: Secondary | ICD-10-CM

## 2018-12-04 DIAGNOSIS — F10230 Alcohol dependence with withdrawal, uncomplicated: Secondary | ICD-10-CM | POA: Insufficient documentation

## 2018-12-04 NOTE — ED Triage Notes (Signed)
Patient was picked up from a hotel requesting detox.  Patient states he usually drinks hard liquor, but has decreased amount in the last 10 days. Left earlier this morning after triage.

## 2018-12-04 NOTE — ED Provider Notes (Signed)
[facility] Provider Note MRN:  [id number]  Arrival date & time: 12/04/18     Chief Complaint   Detox History of Present Illness   Parker BergamoMarcus Berg is a 53 y.o. year-old male with a history of substance abuse presenting to the ED with chief complaint of detox.  Patient explains that he is normally a pint of liquor drinker every day.  He stopped drinking ays ago, has been feeling very bad.  He continues to use drugs, last night using methamphetamine.  He is concerned about his substance abuse and wants assistance quitting.  He explains that he is passed out or blacked out 4 times in the past few weeks.  Currently he denies chest pain or shortness of breath, no abdominal pain, no numbness weakness to the arms or legs.  Review of Systems  A complete 10 system review of systems was obtained and all systems are negative except as noted in the HPI and PMH.   Patient's Health History    Past Medical History:  Diagnosis Date  . Anxiety   . Depression   . GERD (gastroesophageal reflux disease)   . Substance abuse [facility](HCC)     Past Surgical History:  Procedure Laterality Date  . CHOLECYSTECTOMY N/A 09/21/2012   Procedure: LAPAROSCOPIC CHOLECYSTECTOMY WITH INTRAOPERATIVE CHOLANGIOGRAM;  Surgeon: Robyne AskewPaul S Toth III, MD;  [facility];  Service: General;  Laterality: N/A;    No family history on file.  Social History   Socioeconomic History  . Marital status: Single    Spouse name: Not on file  . Number of children: Not on file  . Years of education: Not on file  . Highest education level: Not on file  Occupational History  . Not on file  Social Needs  . Financial resource strain: Not on file  . Food insecurity    Worry: Not on file    Inability: Not on file  . Transportation needs    Medical: Not on file    Non-medical: Not on file  Tobacco Use  . Smoking status: Current Every Day Smoker    Types: Cigarettes  . Smokeless tobacco: Never Used   Substance and Sexual Activity  . Alcohol use: Yes    Comment: Last drink 3 hours ago; 24 oz beer   . Drug use: Yes    Types: Marijuana, "Crack" cocaine  . Sexual activity: Not on file  Lifestyle  . Physical activity    Days per week: Not on file    Minutes per session: Not on file  . Stress: Not on file  Relationships  . Social Musicianconnections    Talks on phone: Not on file    Gets together: Not on file    Attends religious service: Not on file    Active member of club or organization: Not on file    Attends meetings of clubs or organizations: Not on file    Relationship status: Not on file  . Intimate partner violence    Fear of current or ex partner: Not on file    Emotionally abused: Not on file    Physically abused: Not on file    Forced sexual activity: Not on file  Other Topics Concern  . Not on file  Social History Narrative  . Not on file     Physical Exam  Vital Signs and Nursing Notes reviewed Vitals:   12/04/18 0800 12/04/18 0801  BP:    Pulse: 60 60  Resp:  13  Temp:    SpO2: 99% 97%    CONSTITUTIONAL: Chronically ill-appearing, NAD NEURO:  Alert and oriented x 3, no focal deficits EYES:  eyes equal and reactive ENT/NECK:  no LAD, no JVD CARDIO: Regular rate, well-perfused, normal S1 and S2 PULM:  CTAB no wheezing or rhonchi GI/GU:  normal bowel sounds, non-distended, non-tender MSK/SPINE:  No gross deformities, no edema SKIN:  no rash, atraumatic PSYCH:  Appropriate speech and behavior  Diagnostic and Interventional Summary    EKG Interpretation  Date/Time:  Thursday December 04 2018 08:02:23 EDT Ventricular Rate:  56 PR Interval:    QRS Duration: 107 QT Interval:  459 QTC Calculation: 443 R Axis:   81 Text Interpretation:  Sinus rhythm Confirmed by Gerlene Fee [phone]) on 12/04/2018 8:06:43 AM      Labs Reviewed - No data to display  No orders to display    Medications - No data to display   Procedures Critical Care  ED Course and  Medical Decision Making  I have reviewed the triage vital signs and the nursing notes.  Pertinent labs & imaging results that were available during my care of the patient were reviewed by me and considered in my medical decision making (see below for details).  Blackout spells in the setting of polysubstance abuse in this 53 year old male.  He has normal vital signs, normal neurological exam, currently without any objective signs of withdrawal, which makes sense given his timeline of no alcohol for the past 4 days.  Given the possible syncopal events, will screen with EKG and afterwards if without concerns, will discharge with outpatient resources.  EKG without acute concerns.  Patient is without SI or HI or AVH, appropriate for outpatient management.  After the discussed management above, the patient was determined to be safe for discharge.  The patient was in agreement with this plan and all questions regarding their care were answered.  ED return precautions were discussed and the patient will return to the ED with any significant worsening of condition.  Barth Kirks. Sedonia Small, [place] [email or web address]  Final Clinical Impressions(s) / ED Diagnoses     ICD-10-CM   1. Alcohol withdrawal syndrome without complication (HCC)  I34.742   2. Polysubstance abuse [facility])  F19.10     ED Discharge Orders    None         Maudie Flakes, MD 12/04/18 [phone]

## 2018-12-04 NOTE — Discharge Instructions (Addendum)
You were evaluated in the Emergency Department and after careful evaluation, we did not find any emergent condition requiring admission or further testing in the hospital.  Your symptoms today seem to be due to drug use and alcohol withdrawal.  It is encouraging that you have stopped drinking alcohol.  Please continue to refrain from drugs and alcohol and use the resources provided for support.  Please return to the Emergency Department if you experience any worsening of your condition.  We encourage you to follow up with a primary care provider.  Thank you for allowing [place] to be a part of your care.

## 2018-12-06 ENCOUNTER — Encounter (HOSPITAL_COMMUNITY): Payer: Self-pay

## 2018-12-06 ENCOUNTER — Other Ambulatory Visit: Payer: Self-pay

## 2018-12-06 ENCOUNTER — Emergency Department (HOSPITAL_COMMUNITY)
Admission: EM | Admit: 2018-12-06 | Discharge: 2018-12-07 | Disposition: A | Discharge status: Home / Self Care | Payer: Self-pay | Attending: Emergency Medicine | Admitting: Emergency Medicine

## 2018-12-06 DIAGNOSIS — L02416 Cutaneous abscess of left lower limb: Secondary | ICD-10-CM | POA: Insufficient documentation

## 2018-12-06 DIAGNOSIS — L0291 Cutaneous abscess, unspecified: Secondary | ICD-10-CM

## 2018-12-06 DIAGNOSIS — F17210 Nicotine dependence, cigarettes, uncomplicated: Secondary | ICD-10-CM | POA: Insufficient documentation

## 2018-12-06 DIAGNOSIS — Z23 Encounter for immunization: Secondary | ICD-10-CM | POA: Insufficient documentation

## 2018-12-06 MED ORDER — LIDOCAINE-EPINEPHRINE (PF) 2 %-1:200000 IJ SOLN
10.0000 mL | Freq: Once | INTRAMUSCULAR | Status: AC
  Administered 2018-12-06: 10 mL via INTRADERMAL
  Filled 2018-12-06: qty 20

## 2018-12-06 MED ORDER — DOXYCYCLINE HYCLATE 100 MG PO CAPS: 100.0000 mg | ORAL_CAPSULE | Freq: Two times a day (BID) | ORAL | 0 refills | Status: AC

## 2018-12-06 MED ORDER — TETANUS-DIPHTH-ACELL PERTUSSIS 5-2.5-18.5 LF-MCG/0.5 IM SUSP
0.5000 mL | Freq: Once | INTRAMUSCULAR | Status: AC
  Administered 2018-12-06: 0.5 mL via INTRAMUSCULAR
  Filled 2018-12-06: qty 0.5

## 2018-12-06 NOTE — ED Notes (Signed)
Patient verbalizes understanding of discharge instructions. Opportunity for questioning and answers were provided. Armband removed by staff, pt discharged from ED.  

## 2018-12-06 NOTE — ED Triage Notes (Signed)
The patient has had an abscess to the left knee and it has gotten worse.  According to PTAR he may have been seen at [facility] but nothing was done.  Patient was transported to ED to be evaluated.

## 2018-12-06 NOTE — ED Notes (Signed)
[facility] is sending [place] taxi to pick up pt.

## 2018-12-06 NOTE — ED Provider Notes (Signed)
[facility] Provider Note   CSN: [id number] Arrival date & time: 12/06/18  1912     History   Chief Complaint Chief Complaint  Patient presents with  . Abscess    HPI Parker Berg is a 53 y.o. male.     The history is provided by the patient and medical records. No language interpreter was used.  Abscess Associated symptoms: no fever      53 year old male with history of polysubstance abuse including alcohol and cocaine use and opiate use presenting for evaluation of skin infection.  Patient report for the past week he has noticed increasing pain and swelling noted to his left lower thigh near his knee.  Area is tender to palpation oozing out small amount of pus.  Pain is moderate in severity.  No fever or chills.  No injury.  Unable to recall last tetanus status.  No specific treatment tried.  Past Medical History:  Diagnosis Date  . Anxiety   . Depression   . GERD (gastroesophageal reflux disease)   . Substance abuse [facility](HCC)     Patient Active Problem List   Diagnosis Date Noted  . Alcohol abuse   . Severe episode of recurrent major depressive disorder, with psychotic features (HCC) 05/23/2013  . Anxiety state, unspecified 05/23/2013  . Alcohol dependence (HCC) 05/23/2013  . Alcohol withdrawal (HCC) 05/23/2013  . Cocaine abuse (HCC) 05/23/2013  . Opiate dependence (HCC) 05/23/2013    Past Surgical History:  Procedure Laterality Date  . CHOLECYSTECTOMY N/A 09/21/2012   Procedure: LAPAROSCOPIC CHOLECYSTECTOMY WITH INTRAOPERATIVE CHOLANGIOGRAM;  Surgeon: Robyne AskewPaul S Toth III, MD;  Location: [facility] OR;  Service: General;  Laterality: N/A;        Home Medications    Prior to Admission medications   Medication Sig Start Date End Date Taking? Authorizing Provider  QUEtiapine (SEROQUEL) 25 MG tablet Take 1 tablet (25 mg total) by mouth 3 (three) times daily. Patient not taking: Reported on 08/05/2017 05/31/13   Maryagnes AmosStarkes-Perry, Takia S, FNP   QUEtiapine (SEROQUEL) 50 MG tablet Take 1 tablet (50 mg total) by mouth at bedtime. Patient not taking: Reported on 08/05/2017 05/31/13   Maryagnes AmosStarkes-Perry, Takia S, FNP  sertraline (ZOLOFT) 25 MG tablet Take 1 tablet (25 mg total) by mouth daily. Patient not taking: Reported on 08/05/2017 05/31/13   Maryagnes AmosStarkes-Perry, Takia S, FNP  traZODone (DESYREL) 100 MG tablet Take 1 tablet (100 mg total) by mouth at bedtime as needed and may repeat dose one time if needed for sleep. Patient not taking: Reported on 08/05/2017 05/31/13   Maryagnes AmosStarkes-Perry, Takia S, FNP    Family History History reviewed. No pertinent family history.  Social History Social History   Tobacco Use  . Smoking status: Current Every Day Smoker    Types: Cigarettes  . Smokeless tobacco: Never Used  Substance Use Topics  . Alcohol use: Yes    Comment: Last drink 3 hours ago; 24 oz beer   . Drug use: Yes    Types: Marijuana, "Crack" cocaine     Allergies   Patient has no known allergies.   Review of Systems Review of Systems  Constitutional: Negative for fever.  Skin: Positive for wound.  Neurological: Negative for numbness.     Physical Exam Updated Vital Signs BP (!) 139/92 (BP Location: Right Arm)   Pulse 65   Temp 98.2 F (36.8 C) (Oral)   Resp 16   SpO2 98%   Physical Exam Vitals signs and nursing note reviewed.  Constitutional:      General: He is not in acute distress.    Appearance: He is well-developed.  HENT:     Head: Atraumatic.  Eyes:     Conjunctiva/sclera: Conjunctivae normal.  Neck:     Musculoskeletal: Neck supple.  Skin:    Findings: No rash.     Comments: Left leg: On the distal medial thigh near the knee there is an area of induration with fluctuant oozing a small amount of purulent discharge it is tender to palpation with surrounding erythema.  Neurological:     Mental Status: He is alert.      ED Treatments / Results  Labs (all labs ordered are listed, but only abnormal results are  displayed) Labs Reviewed - No data to display  EKG None  Radiology No results found.  Procedures .Marland KitchenIncision and Drainage  Date/Time: 12/06/2018 10:34 PM Performed by: Domenic Moras, PA-C Authorized by: Domenic Moras, PA-C   Consent:    Consent obtained:  Verbal   Consent given by:  Patient   Risks discussed:  Bleeding, incomplete drainage, pain and damage to other organs   Alternatives discussed:  No treatment Universal protocol:    Procedure explained and questions answered to patient or proxy's satisfaction: yes     Relevant documents present and verified: yes     Test results available and properly labeled: yes     Imaging studies available: yes     Required blood products, implants, devices, and special equipment available: yes     Site/side marked: yes     Immediately prior to procedure a time out was called: yes     Patient identity confirmed:  Verbally with patient Location:    Type:  Abscess   Size:  2cm   Location:  Lower extremity   Lower extremity location:  Leg   Leg location:  R upper leg Pre-procedure details:    Skin preparation:  Betadine Anesthesia (see MAR for exact dosages):    Anesthesia method:  Local infiltration   Local anesthetic:  Lidocaine 1% WITH epi Procedure type:    Complexity:  Simple Procedure details:    Incision types:  Single straight   Incision depth:  Subcutaneous   Scalpel blade:  11   Wound management:  Probed and deloculated, irrigated with saline and extensive cleaning   Drainage:  Purulent   Drainage amount:  Scant   Packing materials:  None Post-procedure details:    Patient tolerance of procedure:  Tolerated well, no immediate complications   (including critical care time)  Medications Ordered in ED Medications - No data to display   Initial Impression / Assessment and Plan / ED Course  I have reviewed the triage vital signs and the nursing notes.  Pertinent labs & imaging results that were available during my care  of the patient were reviewed by me and considered in my medical decision making (see chart for details).        BP (!) 139/92 (BP Location: Right Arm)   Pulse 65   Temp 98.2 F (36.8 C) (Oral)   Resp 16   SpO2 98%    Final Clinical Impressions(s) / ED Diagnoses   Final diagnoses:  Abscess    ED Discharge Orders         Ordered    doxycycline (VIBRAMYCIN) 100 MG capsule  2 times daily     12/06/18 2235         10:01 PM Patient develop a cutaneous abscess to  his left distal thigh near the knee without any joint involvement.  Will perform I&D.  Will update tetanus.  10:34 PM I&D with scan purulent discharge.  Will prescribe abx.  Appropriate wound care discussed.  Return precaution given.    Fayrene Helperran, Willena Jeancharles, PA-C 12/06/18 2236    Virgina Norfolkuratolo, Adam, DO 12/06/18 2353

## 2018-12-06 NOTE — ED Notes (Signed)
Called [facility] to see if they can pick up the pt.

## 2018-12-07 NOTE — ED Notes (Signed)
Pt discharged to [facility] by Betsy Pries

## 2018-12-18 ENCOUNTER — Emergency Department (HOSPITAL_COMMUNITY)
Admission: EM | Admit: 2018-12-18 | Discharge: 2018-12-19 | Disposition: A | Discharge status: Home / Self Care | Payer: Self-pay | Attending: Emergency Medicine | Admitting: Emergency Medicine

## 2018-12-18 ENCOUNTER — Other Ambulatory Visit: Payer: Self-pay

## 2018-12-18 ENCOUNTER — Encounter (HOSPITAL_COMMUNITY): Payer: Self-pay

## 2018-12-18 DIAGNOSIS — T401X1A Poisoning by heroin, accidental (unintentional), initial encounter: Secondary | ICD-10-CM | POA: Insufficient documentation

## 2018-12-18 DIAGNOSIS — F17210 Nicotine dependence, cigarettes, uncomplicated: Secondary | ICD-10-CM | POA: Insufficient documentation

## 2018-12-18 LAB — CBG MONITORING, ED: Glucose-Capillary: 98 mg/dL (ref 70–99)

## 2018-12-18 MED ORDER — SODIUM CHLORIDE 0.9 % IV BOLUS
1000.0000 mL | Freq: Once | INTRAVENOUS | Status: AC
  Administered 2018-12-19: 1000 mL via INTRAVENOUS

## 2018-12-18 MED ORDER — SODIUM CHLORIDE 0.9 % IV SOLN: INTRAVENOUS | Status: DC

## 2018-12-18 NOTE — ED Triage Notes (Signed)
Per EMS, Pt was found not breathing and unresponsive, apparent OD. Pt given 4mg  narcan IN. Pt was then responsive 4x. Pt became less responsive and lethargic during transport. Pt placed on 2L of oxygen.

## 2018-12-18 NOTE — ED Provider Notes (Signed)
TIME SEEN: 11:30 PM  CHIEF COMPLAINT: Possible overdose  HPI: Patient is a 53 year old male with history of substance abuse who presents to the emergency department after he was found unresponsive in his car in [employer] parking lot.  Patient was not breathing initially was being bagged at the scene.  Pupils were pinpoint.  Was given 4 mg of Narcan intranasally.  EMS reports on their arrival sats were in the 80s.  He has improved after Narcan.  He is still drowsy but able to answer questions.  He denies taking any medications today or using any illicit drugs.  He states he did drink 1 beer.  He denies any complaints of pain.  States he was previously on Seroquel but has been off this medication for 5 days.  Denies recent fevers, cough, vomiting, diarrhea.  ROS: See HPI Constitutional: no fever  Eyes: no drainage  ENT: no runny nose   Cardiovascular:  no chest pain  Resp: no SOB  GI: no vomiting GU: no dysuria Integumentary: no rash  Allergy: no hives  Musculoskeletal: no leg swelling  Neurological: no slurred speech ROS otherwise negative  PAST MEDICAL HISTORY/PAST SURGICAL HISTORY:  Past Medical History:  Diagnosis Date  . Anxiety   . Depression   . GERD (gastroesophageal reflux disease)   . Substance abuse ([place])     MEDICATIONS:  Prior to Admission medications   Medication Sig Start Date End Date Taking? Authorizing Provider  doxycycline (VIBRAMYCIN) 100 MG capsule Take 1 capsule (100 mg total) by mouth 2 (two) times daily. One po bid x 7 days 12/06/18   Domenic Moras, PA-C  QUEtiapine (SEROQUEL) 25 MG tablet Take 1 tablet (25 mg total) by mouth 3 (three) times daily. Patient not taking: Reported on 08/05/2017 05/31/13   Suella Broad, FNP  QUEtiapine (SEROQUEL) 50 MG tablet Take 1 tablet (50 mg total) by mouth at bedtime. Patient not taking: Reported on 08/05/2017 05/31/13   Suella Broad, FNP  sertraline (ZOLOFT) 25 MG tablet Take 1 tablet (25 mg total) by mouth  daily. Patient not taking: Reported on 08/05/2017 05/31/13   Suella Broad, FNP  traZODone (DESYREL) 100 MG tablet Take 1 tablet (100 mg total) by mouth at bedtime as needed and may repeat dose one time if needed for sleep. Patient not taking: Reported on 08/05/2017 05/31/13   Suella Broad, FNP    ALLERGIES:  No Known Allergies  SOCIAL HISTORY:  Social History   Tobacco Use  . Smoking status: Current Every Day Smoker    Types: Cigarettes  . Smokeless tobacco: Never Used  Substance Use Topics  . Alcohol use: Yes    Comment: Last drink 3 hours ago; 24 oz beer     FAMILY HISTORY: No family history on file.  EXAM: BP (!) 139/96 (BP Location: Left Arm)   Pulse 71   Temp 98.5 F (36.9 C) (Oral)   Resp 11   Ht 5\' 6"  (1.676 m) Comment: Simultaneous filing. User may not have seen previous data.  Wt 65.8 kg Comment: Simultaneous filing. User may not have seen previous data.  SpO2 97%   BMI 23.40 kg/m  CONSTITUTIONAL: Alert and oriented and responds appropriately to questions.  Chronically ill-appearing HEAD: Normocephalic EYES: Conjunctivae clear, pinpoint pupils, extraocular movements intact ENT: normal nose; moist mucous membranes NECK: Supple, no meningismus, no nuchal rigidity, no LAD  CARD: RRR; S1 and S2 appreciated; no murmurs, no clicks, no rubs, no gallops RESP: Normal chest excursion without  splinting or tachypnea; breath sounds clear and equal bilaterally; no wheezes, no rhonchi, no rales, no hypoxia or respiratory distress, speaking full sentences ABD/GI: Normal bowel sounds; non-distended; soft, non-tender, no rebound, no guarding, no peritoneal signs, no hepatosplenomegaly BACK:  The back appears normal and is non-tender to palpation, there is no CVA tenderness EXT: Normal ROM in all joints; non-tender to palpation; no edema; normal capillary refill; no cyanosis, no calf tenderness or swelling    SKIN: Normal color for age and race; warm; no rash NEURO:  Moves all extremities equally, no facial asymmetry, normal speech PSYCH: The patient's mood and manner are appropriate. Grooming and personal hygiene are appropriate.  No SI or HI.  MEDICAL DECISION MAKING: Patient here with likely unintentional opiate overdose.  Has history of substance abuse.  Patient's pupils are pinpoint and he responded with 4 mg of intranasal Narcan.  Will monitor closely.  Blood glucose normal.  No signs of trauma.  No obvious focal neurologic deficits.  Denies new medications.  No infectious symptoms.  ED PROGRESS: 2:00 AM  Patient's labs are unremarkable other than leukocytosis which is likely reactive.  Afebrile here and denies any infectious symptoms.  Patient is sleeping without hypoxia or apnea.  Will be monitored until clinically sober.   3:20 AM  Pt's urine drug screen positive for opiates, cocaine, amphetamines and THC.  He is awake, alert without hypoxia.  He denies that this was an attempt to hurt himself.  He admits to using heroin tonight.  His significant other is here to take him home and requesting Narcan from the ED.  States she was given an Rx for Narcan before but it was $160 which they can not afford.  He is able to ambulate and tolerate po.  Have advised him to stop using illicit drugs.  Will give outpatient resources.   At this time, I do not feel there is any life-threatening condition present. I have reviewed and discussed all results (EKG, imaging, lab, urine as appropriate) and exam findings with patient/family. I have reviewed nursing notes and appropriate previous records.  I feel the patient is safe to be discharged home without further emergent workup and can continue workup as an outpatient as needed. Discussed usual and customary return precautions. Patient/family verbalize understanding and are comfortable with this plan.  Outpatient follow-up has been provided as needed. All questions have been answered.     EKG  Interpretation  Date/Time:  Thursday December 18 2018 23:28:34 EDT Ventricular Rate:  76 PR Interval:    QRS Duration: 101 QT Interval:  424 QTC Calculation: 477 R Axis:   51 Text Interpretation:  Sinus rhythm Borderline prolonged QT interval No significant change since last tracing Confirmed by Rochele Raring,  [phone]([id number]) on 12/18/2018 11:30:30 PM         CRITICAL CARE Performed by: Baxter Hire    Total critical care time: 65 minutes  Critical care time was exclusive of separately billable procedures and treating other patients.  Critical care was necessary to treat or prevent imminent or life-threatening deterioration.  Critical care was time spent personally by me on the following activities: development of treatment plan with patient and/or surrogate as well as nursing, discussions with consultants, evaluation of patient's response to treatment, examination of patient, obtaining history from patient or surrogate, ordering and performing treatments and interventions, ordering and review of laboratory studies, ordering and review of radiographic studies, pulse oximetry and re-evaluation of patient's condition.    , Layla Maw N, DO 12/19/18 [phone]

## 2018-12-18 NOTE — ED Notes (Signed)
Unable to obtain lav tube during blood draw

## 2018-12-19 LAB — CBC WITH DIFFERENTIAL/PLATELET
Abs Immature Granulocytes: 0.08 10*3/uL — ABNORMAL HIGH (ref 0.00–0.07)
Basophils Absolute: 0.1 10*3/uL (ref 0.0–0.1)
Basophils Relative: 0 %
Eosinophils Absolute: 0.3 10*3/uL (ref 0.0–0.5)
Eosinophils Relative: 2 %
HCT: 43.7 % (ref 39.0–52.0)
Hemoglobin: 14.2 g/dL (ref 13.0–17.0)
Immature Granulocytes: 0 %
Lymphocytes Relative: 10 %
Lymphs Abs: 1.9 10*3/uL (ref 0.7–4.0)
MCH: 31.4 pg (ref 26.0–34.0)
MCHC: 32.5 g/dL (ref 30.0–36.0)
MCV: 96.7 fL (ref 80.0–100.0)
Monocytes Absolute: 1 10*3/uL (ref 0.1–1.0)
Monocytes Relative: 5 %
Neutro Abs: 16.1 10*3/uL — ABNORMAL HIGH (ref 1.7–7.7)
Neutrophils Relative %: 83 %
Platelets: 304 10*3/uL (ref 150–400)
RBC: 4.52 MIL/uL (ref 4.22–5.81)
RDW: 11.9 % (ref 11.5–15.5)
WBC: 19.5 10*3/uL — ABNORMAL HIGH (ref 4.0–10.5)
nRBC: 0 % (ref 0.0–0.2)

## 2018-12-19 LAB — RAPID URINE DRUG SCREEN, HOSP PERFORMED
Amphetamines: POSITIVE — AB
Barbiturates: NOT DETECTED
Benzodiazepines: NOT DETECTED
Cocaine: POSITIVE — AB
Opiates: POSITIVE — AB
Tetrahydrocannabinol: POSITIVE — AB

## 2018-12-19 LAB — COMPREHENSIVE METABOLIC PANEL
ALT: 13 U/L (ref 0–44)
AST: 21 U/L (ref 15–41)
Albumin: 3.9 g/dL (ref 3.5–5.0)
Alkaline Phosphatase: 81 U/L (ref 38–126)
Anion gap: 12 (ref 5–15)
BUN: 13 mg/dL (ref 6–20)
CO2: 25 mmol/L (ref 22–32)
Calcium: 8.8 mg/dL — ABNORMAL LOW (ref 8.9–10.3)
Chloride: 96 mmol/L — ABNORMAL LOW (ref 98–111)
Creatinine, Ser: 1.22 mg/dL (ref 0.61–1.24)
GFR calc Af Amer: >60 mL/min (ref >60)
GFR calc non Af Amer: >60 mL/min (ref >60)
Glucose, Bld: 107 mg/dL — ABNORMAL HIGH (ref 70–99)
Potassium: 3.2 mmol/L — ABNORMAL LOW (ref 3.5–5.1)
Sodium: 133 mmol/L — ABNORMAL LOW (ref 135–145)
Total Bilirubin: 0.4 mg/dL (ref 0.3–1.2)
Total Protein: 7.7 g/dL (ref 6.5–8.1)

## 2018-12-19 LAB — ACETAMINOPHEN LEVEL: Acetaminophen (Tylenol), Serum: <10 ug/mL — ABNORMAL LOW (ref 10–30)

## 2018-12-19 LAB — ETHANOL: Alcohol, Ethyl (B): <10 mg/dL (ref <10)

## 2018-12-19 LAB — SALICYLATE LEVEL: Salicylate Lvl: <7 mg/dL (ref 2.8–30.0)

## 2018-12-19 MED ORDER — NALOXONE HCL 4 MG/0.1ML NA LIQD
1.0000 | Freq: Once | NASAL | Status: AC
  Administered 2018-12-19: 1 via NASAL
  Filled 2018-12-19: qty 4

## 2018-12-19 NOTE — ED Notes (Signed)
Pt states that he is itching, no physical signs of skin irritation present, and no other signs of allergic reaction. Allergies and medications reviewed. Provider to be informed, and will continue to monitor.

## 2018-12-19 NOTE — Discharge Instructions (Signed)
Your drug screen today was positive for heroin, marijuana, cocaine and amphetamines.  I recommend that you stop using illicit drugs.  I recommend that you follow-up with a rehab facility for further help.

## 2019-03-16 ENCOUNTER — Emergency Department (HOSPITAL_COMMUNITY): Payer: Medicaid Other

## 2019-03-16 ENCOUNTER — Emergency Department (HOSPITAL_COMMUNITY)
Admission: EM | Admit: 2019-03-16 | Discharge: 2019-03-16 | Disposition: A | Discharge status: Home / Self Care | Payer: Medicaid Other | Attending: Emergency Medicine | Admitting: Emergency Medicine

## 2019-03-16 ENCOUNTER — Encounter (HOSPITAL_COMMUNITY): Payer: Self-pay | Admitting: Emergency Medicine

## 2019-03-16 DIAGNOSIS — X500XXA Overexertion from strenuous movement or load, initial encounter: Secondary | ICD-10-CM | POA: Diagnosis not present

## 2019-03-16 DIAGNOSIS — S93402A Sprain of unspecified ligament of left ankle, initial encounter: Secondary | ICD-10-CM | POA: Diagnosis not present

## 2019-03-16 DIAGNOSIS — Y929 Unspecified place or not applicable: Secondary | ICD-10-CM | POA: Diagnosis not present

## 2019-03-16 DIAGNOSIS — F17210 Nicotine dependence, cigarettes, uncomplicated: Secondary | ICD-10-CM | POA: Diagnosis not present

## 2019-03-16 DIAGNOSIS — Y9301 Activity, walking, marching and hiking: Secondary | ICD-10-CM | POA: Diagnosis not present

## 2019-03-16 DIAGNOSIS — S8392XA Sprain of unspecified site of left knee, initial encounter: Secondary | ICD-10-CM | POA: Diagnosis not present

## 2019-03-16 DIAGNOSIS — Y999 Unspecified external cause status: Secondary | ICD-10-CM | POA: Diagnosis not present

## 2019-03-16 DIAGNOSIS — S8992XA Unspecified injury of left lower leg, initial encounter: Secondary | ICD-10-CM | POA: Diagnosis present

## 2019-03-16 MED ORDER — HYDROCODONE-ACETAMINOPHEN 5-325 MG PO TABS
1.0000 | ORAL_TABLET | Freq: Once | ORAL | Status: AC
  Administered 2019-03-16: 18:00:00 1 via ORAL
  Filled 2019-03-16: qty 1

## 2019-03-16 NOTE — Discharge Instructions (Signed)
Follow-up with [facility] to establish a primary care doctor if you do not have one.  You can take Tylenol or Ibuprofen as directed for pain. You can alternate Tylenol and Ibuprofen every 4 hours. If you take Tylenol at 1pm, then you can take Ibuprofen at 5pm. Then you can take Tylenol again at 9pm. Do not exceed 4000 mg of tylenol a day. Do not exceed 800 mg of ibuprofen a day.     Return to the Emergency Department immediately for any worsening pain, redness/swelling of the ankle, gray or blue color to the toes, numbness/weakness of toes or foot, difficulty walking or any other worsening or concerning symptoms.    Ankle sprain Ankle sprain occurs when the ligaments that hold the ankle joint to get her are stretched or torn. It may take 4-6 weeks to heal.  For activity: Use crutches with nonweightbearing for the first few days. Then, you may walk on your ankles as the pain allows, or as instructed. Start gradually with weight bearing on the affected ankle. Once you can walk pain free, then try jogging. When you can run forwards, then you can try moving side to side. If you cannot walk without crutches in one week, you need a recheck by your Family Doctor.  If you do not have a family doctor to followup with, you can see the list of phone numbers below. Please call today to make a followup appointment.   RICE therapy:  Routine Care for injuries  Rest, Ice, Compression, Elevation (RICE)  Rest is needed to allow your body to heal. Routine activities can be resumed when comfortable. Injury tendons and bones can take up to 6 weeks to heal. Tendons are cordlike structures that attach muscles and bones.  Ice following an injury helps keep the swelling down and reduce the pain. Put ice in a plastic bag. Place a towel between your skin and the bag of ice. Leave the ice on for 15-20 minutes, 3-4 times a day. Do this while awake, for the first 24-48 hours. After that continue as directed by  your caregiver.  Compression helps keep swelling down. It also gives support and helps with discomfort. If any lasting bandage has been applied, it should be removed and reapplied every 3-4 hours. It should not be applied tightly, but firmly enough to keep swelling down. Watch fingers or toes for swelling, discoloration, coldness, numbness or excessive pain. If any of these problems occur, removed the bandage and reapply loosely. Contact your caregiver if these problems continue.  Elevation helps reduce swelling and decrease your pain. With extremities such as the arms, hands, legs and feet, the injured area should be placed near or above the level of the heart if possible.

## 2019-03-16 NOTE — ED Notes (Signed)
Pt reports he did not drive and someone is picking him up.

## 2019-03-16 NOTE — ED Notes (Signed)
Pt not in waiting room or outside 

## 2019-03-16 NOTE — ED Triage Notes (Signed)
Pt states he jumped over a fence last night and think he may have broken his left ankle and injured his left knee.

## 2019-03-16 NOTE — ED Provider Notes (Signed)
[facility] EMERGENCY DEPARTMENT Provider Note   CSN: [id number] Arrival date & time: 03/16/19  1449     History   Chief Complaint Chief Complaint  Patient presents with  . Ankle Pain  . Knee Injury    HPI Parker Berg is a 53 y.o. male past medical history of anxiety, depression who presents for evaluation of left knee and left ankle pain that began this morning.  He states last night, he jumped over a fence in order to get to a store.  He states that he landed on both feet and does not recall any twisting of his ankle or fall.  He states he was able to continue walking last night but states that this morning, when he woke up he had pain to his left knee and left ankle.  He states that the pain is worse when he tries to ambulate or bear weight on the extremity.  He describes it as a shooting and throbbing pain.  He states most of the pain is to the medial aspect of his left knee as well as both the medial lateral aspect of his left ankle.  He has not taken any medications for the pain.  He denies any numbness/weakness.  He denies any head injury, LOC.   The history is provided by the patient.    Past Medical History:  Diagnosis Date  . Anxiety   . Depression   . GERD (gastroesophageal reflux disease)   . Substance abuse [facility])     Patient Active Problem List   Diagnosis Date Noted  . Alcohol abuse   . Severe episode of recurrent major depressive disorder, with psychotic features ([facility]) 05/23/2013  . Anxiety state, unspecified 05/23/2013  . Alcohol dependence ([facility]) 05/23/2013  . Alcohol withdrawal ([facility]) 05/23/2013  . Cocaine abuse ([facility]) 05/23/2013  . Opiate dependence (HCC) 05/23/2013    Past Surgical History:  Procedure Laterality Date  . CHOLECYSTECTOMY N/A 09/21/2012   Procedure: LAPAROSCOPIC CHOLECYSTECTOMY WITH INTRAOPERATIVE CHOLANGIOGRAM;  Surgeon: Robyne Askew, MD;  Location: [facility] OR;  Service: General;  Laterality: N/A;        Home  Medications    Prior to Admission medications   Medication Sig Start Date End Date Taking? Authorizing Provider  doxycycline (VIBRAMYCIN) 100 MG capsule Take 1 capsule (100 mg total) by mouth 2 (two) times daily. One po bid x 7 days 12/06/18   Fayrene Helper, PA-C  QUEtiapine (SEROQUEL) 25 MG tablet Take 1 tablet (25 mg total) by mouth 3 (three) times daily. Patient not taking: Reported on 08/05/2017 05/31/13   Maryagnes Amos, FNP  QUEtiapine (SEROQUEL) 50 MG tablet Take 1 tablet (50 mg total) by mouth at bedtime. Patient not taking: Reported on 08/05/2017 05/31/13   Maryagnes Amos, FNP  sertraline (ZOLOFT) 25 MG tablet Take 1 tablet (25 mg total) by mouth daily. Patient not taking: Reported on 08/05/2017 05/31/13   Maryagnes Amos, FNP  traZODone (DESYREL) 100 MG tablet Take 1 tablet (100 mg total) by mouth at bedtime as needed and may repeat dose one time if needed for sleep. Patient not taking: Reported on 08/05/2017 05/31/13   Maryagnes Amos, FNP    Family History No family history on file.  Social History Social History   Tobacco Use  . Smoking status: Current Every Day Smoker    Types: Cigarettes  . Smokeless tobacco: Never Used  Substance Use Topics  . Alcohol use: Yes    Comment: Last drink  3 hours ago; 24 oz beer   . Drug use: Yes    Types: Marijuana, "Crack" cocaine     Allergies   Patient has no known allergies.   Review of Systems Review of Systems  Musculoskeletal:       Left ankle Left pain  Neurological: Negative for weakness and numbness.  All other systems reviewed and are negative.    Physical Exam Updated Vital Signs BP 130/66 (BP Location: Right Arm)   Pulse 66   Temp 98.2 F (36.8 C) (Oral)   Resp 16   SpO2 99%   Physical Exam Vitals signs and nursing note reviewed.  Constitutional:      Appearance: He is well-developed.  HENT:     Head: Normocephalic and atraumatic.  Eyes:     General: No scleral icterus.       Right  eye: No discharge.        Left eye: No discharge.     Conjunctiva/sclera: Conjunctivae normal.  Cardiovascular:     Pulses:          Dorsalis pedis pulses are 2+ on the right side and 2+ on the left side.  Pulmonary:     Effort: Pulmonary effort is normal.  Musculoskeletal:     Comments: Tenderness palpation to medial aspect of left knee with some mild soft tissue swelling.  No deformity or crepitus noted.  Negative anterior and posterior drawer test.  He does have some pain with valgus stress.  No instability noted with varus or valgus stress.  No bony tenderness noted to left tib-fib.  Tenderness palpation noted to the medial lateral aspect of the left ankle.  No overlying soft tissue line, ecchymosis, deformity or crepitus noted.  Pedal wiggle all 5 toes without difficulty.  No tenderness palpation of the right lower extremity.  Skin:    General: Skin is warm and dry.     Capillary Refill: Capillary refill takes less than 2 seconds.     Comments: Good distal cap refill.  RLE is not dusky in appearance or cool to touch.  Neurological:     Mental Status: He is alert.     Comments: Sensation intact along major nerve distributions of BLE  Psychiatric:        Speech: Speech normal.        Behavior: Behavior normal.      ED Treatments / Results  Labs (all labs ordered are listed, but only abnormal results are displayed) Labs Reviewed - No data to display  EKG None  Radiology Dg Ankle Complete Left  Result Date: 03/16/2019 CLINICAL DATA:  Ankle pain EXAM: LEFT ANKLE COMPLETE - 3+ VIEW COMPARISON:  None. FINDINGS: Negative for fracture. Ankle joint space normal. No cortical defect or joint effusion. IMPRESSION: Negative. Electronically Signed   By: Marlan Palauharles  Clark M.D.   On: 03/16/2019 15:34   Dg Knee Complete 4 Views Left  Result Date: 03/16/2019 CLINICAL DATA:  Knee pain EXAM: LEFT KNEE - COMPLETE 4+ VIEW COMPARISON:  None. FINDINGS: No evidence of fracture, dislocation, or joint  effusion. No evidence of arthropathy or other focal bone abnormality. Soft tissues are unremarkable. IMPRESSION: Negative. Electronically Signed   By: Marlan Palauharles  Clark M.D.   On: 03/16/2019 15:36    Procedures Procedures (including critical care time)  Medications Ordered in ED Medications  HYDROcodone-acetaminophen (NORCO/VICODIN) 5-325 MG per tablet 1 tablet (1 tablet Oral Given 03/16/19 1809)     Initial Impression / Assessment and Plan / ED Course  I have reviewed the triage vital signs and the nursing notes.  Pertinent labs & imaging results that were available during my care of the patient were reviewed by me and considered in my medical decision making (see chart for details).  Clinical Course as of Mar 15 1949  [facility] Mar 16, 2019  1738 DG Ankle Complete Left [CA]    Clinical Course User Index [CA] [place], [place], [place]       53 year old-year-old who presents for evaluation of left ankle and left knee pain.  Reports that he jumped over a fence last night and since then has had pain to left ankle and left knee.  No head injury, LOC.  Reports worsening pain with ambulation. Patient is afebrile, non-toxic appearing, sitting comfortably on examination table. Vital signs reviewed and stable.  Patient is neurovascularly intact.  On exam, he has tenderness palpation noted to left knee, left ankle.  Concern for sprain versus dislocation versus fracture.  X-rays ordered at triage.  History/physical exam not concerning for septic arthritis, acute arterial embolism/ischemic leg.  X-rays reviewed.  Negative for any acute bony abnormality in the knee or the ankle.  Discussed results with patient.  Discussed with him that there still could be a ligamentous/muscular skeletal injury.  We will plan to treat as knee and ankle sprain.  Patient placed in an ASO splint as well as knee sleeve and given crutches.  Encouraged at home supportive care measures. At this time, patient exhibits no  emergent life-threatening condition that require further evaluation in ED or admission. Patient had ample opportunity for questions and discussion. All patient's questions were answered with full understanding. Strict return precautions discussed. Patient expresses understanding and agreement to plan.   Portions of this note were generated with [employer]. Dictation errors may occur despite best attempts at proofreading.  Final Clinical Impressions(s) / ED Diagnoses   Final diagnoses:  Sprain of left ankle, unspecified ligament, initial encounter  Sprain of left knee, unspecified ligament, initial encounter    ED Discharge Orders    None       Desma Mcgregor 03/16/19 1950    Davonna Belling, MD 03/16/19 2324

## 2021-03-26 IMAGING — CR DG KNEE COMPLETE 4+V*L*
4 series · 4 of 4 positions shown · non-contrast
Comparison: None.

CLINICAL DATA: Knee pain

EXAM:
LEFT KNEE - COMPLETE 4+ VIEW

[knee ap]
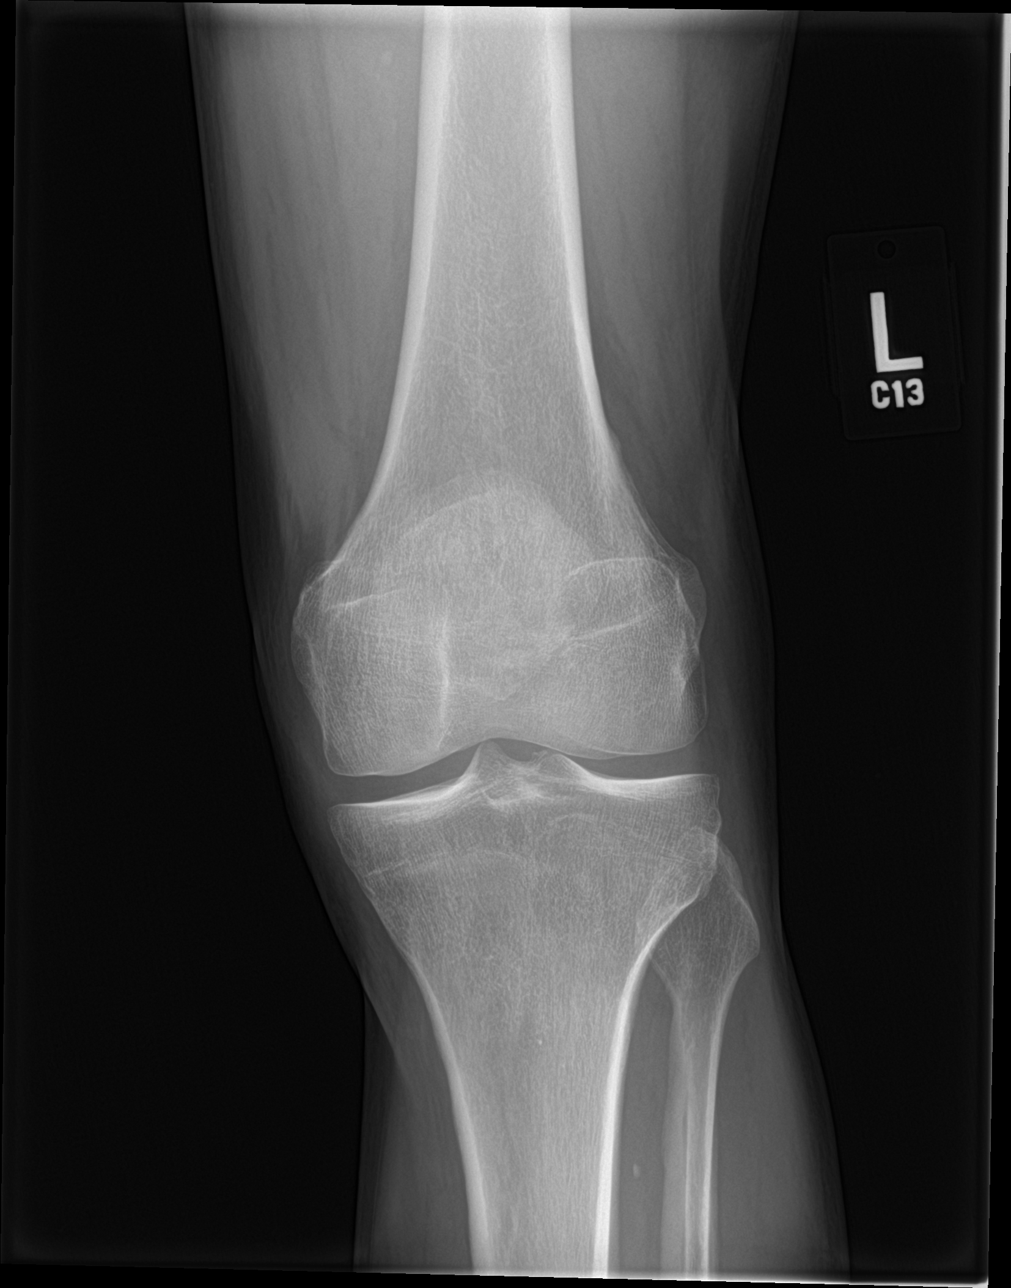

[knee lat]
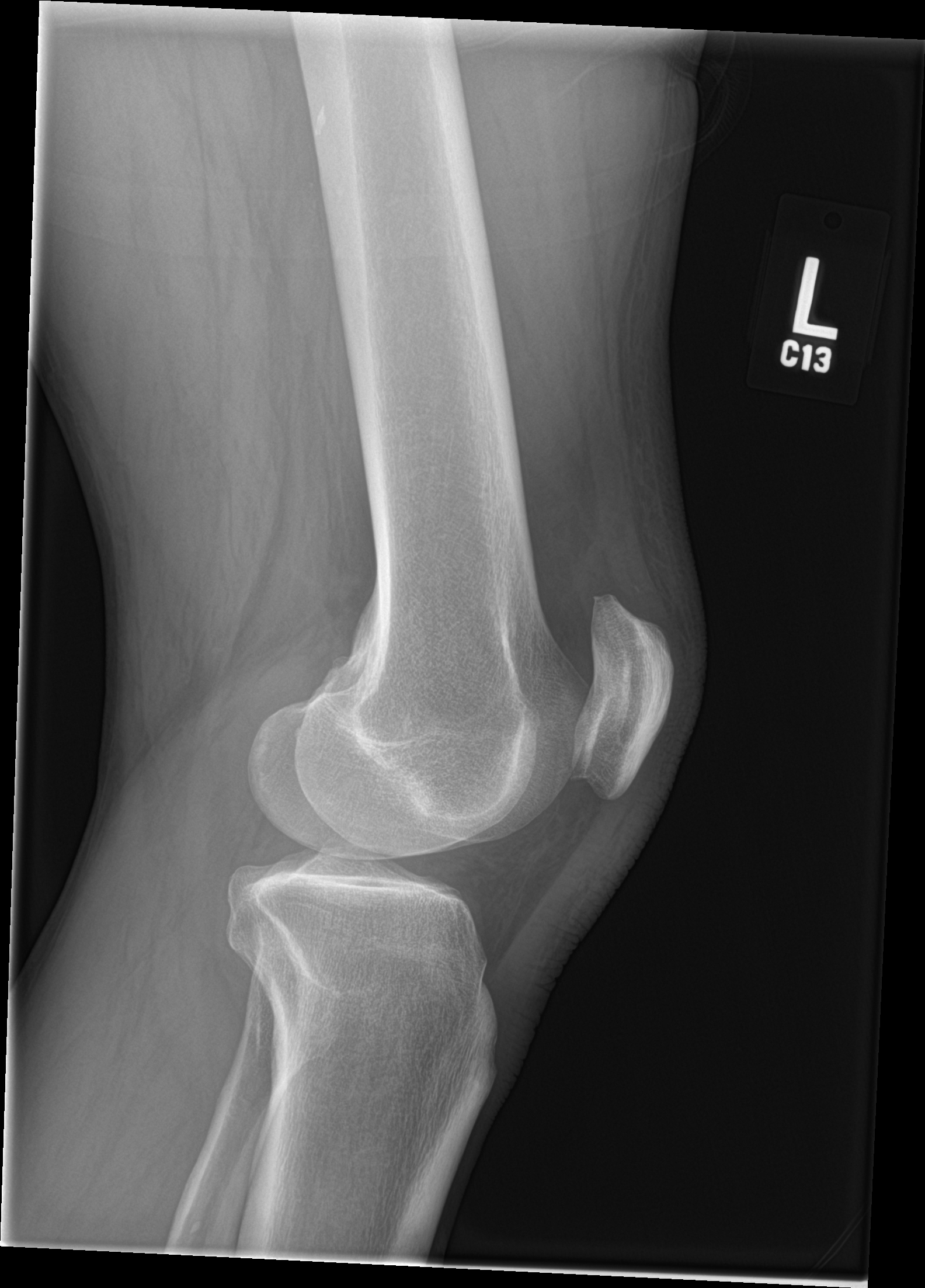

[knee obl (1 of 2)]
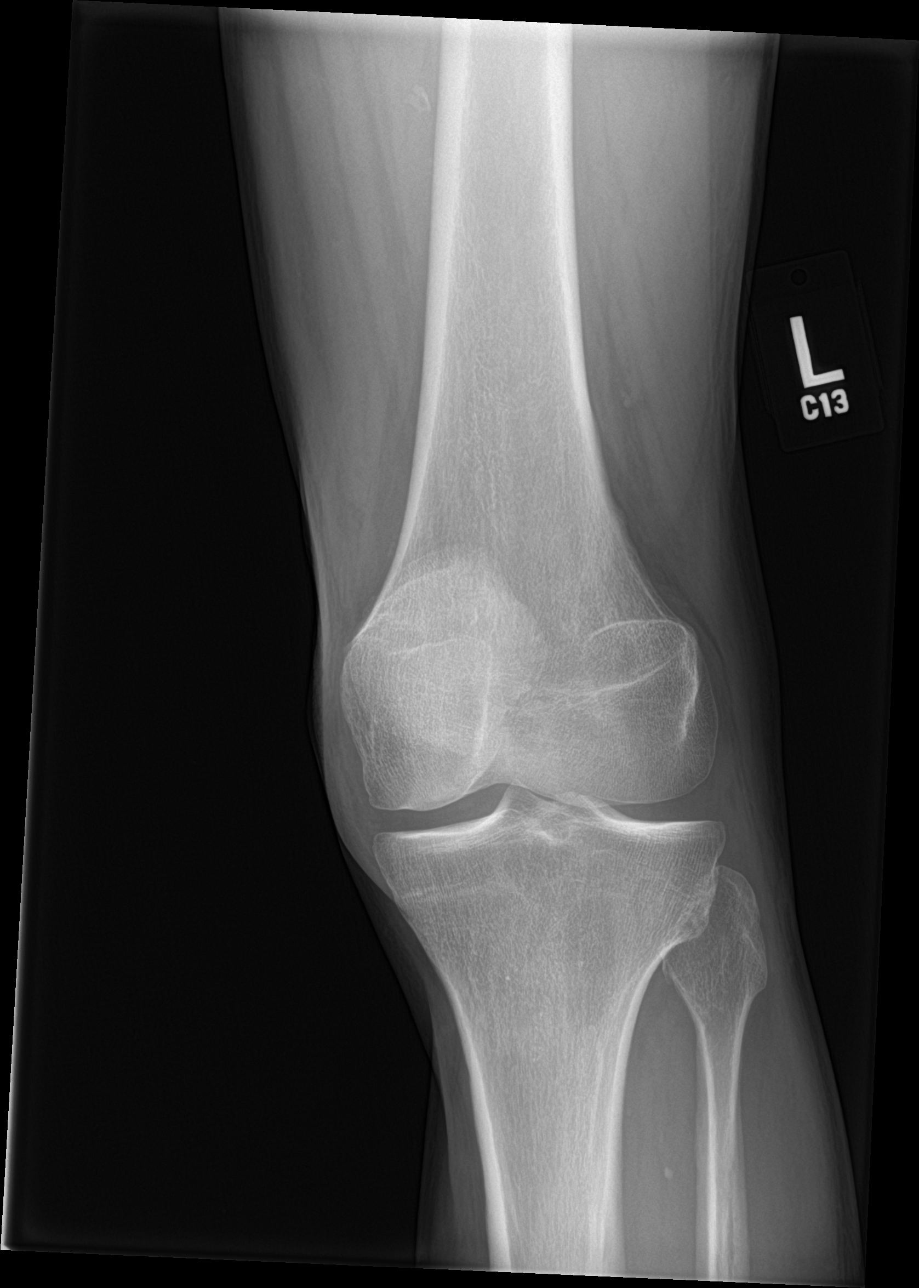

[knee obl (2 of 2)]
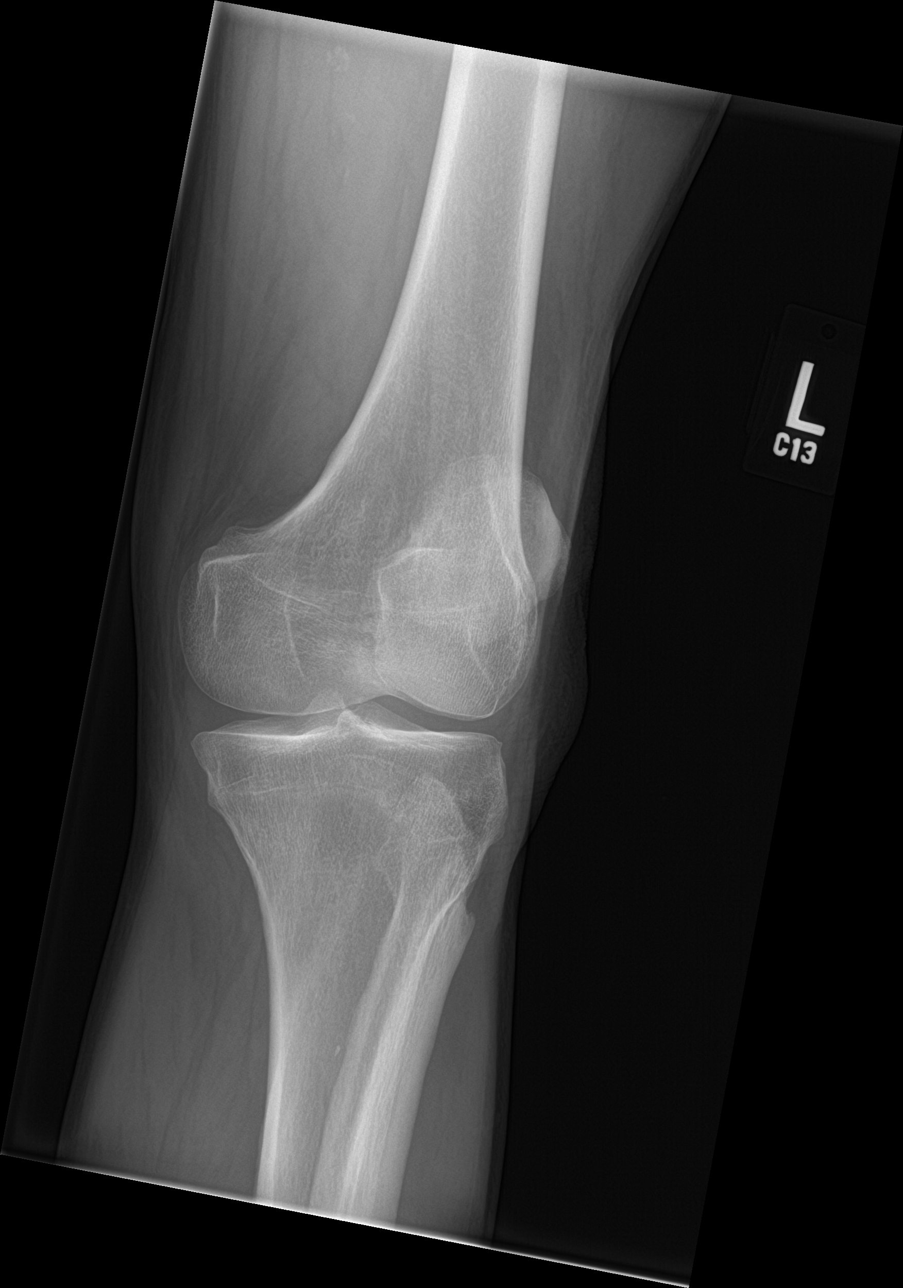

[4 of 4 positions shown; findings below may reference images not displayed]

FINDINGS: No evidence of fracture, dislocation, or joint effusion. No evidence
of arthropathy or other focal bone abnormality. Soft tissues are
unremarkable.
IMPRESSION: Negative.

## 2023-07-27 DIAGNOSIS — R1084 Generalized abdominal pain: Secondary | ICD-10-CM | POA: Insufficient documentation

## 2023-07-27 DIAGNOSIS — R112 Nausea with vomiting, unspecified: Secondary | ICD-10-CM | POA: Insufficient documentation

## 2023-07-28 ENCOUNTER — Other Ambulatory Visit: Payer: Self-pay

## 2023-07-28 ENCOUNTER — Encounter (HOSPITAL_COMMUNITY): Payer: Self-pay

## 2023-07-28 ENCOUNTER — Emergency Department (HOSPITAL_COMMUNITY)
Admission: EM | Admit: 2023-07-28 | Discharge: 2023-07-28 | Disposition: A | Discharge status: Home / Self Care | Payer: Self-pay | Attending: Emergency Medicine | Admitting: Emergency Medicine

## 2023-07-28 DIAGNOSIS — R112 Nausea with vomiting, unspecified: Secondary | ICD-10-CM

## 2023-07-28 LAB — COMPREHENSIVE METABOLIC PANEL
ALT: 11 U/L (ref 0–44)
AST: 22 U/L (ref 15–41)
Albumin: 3.8 g/dL (ref 3.5–5.0)
Alkaline Phosphatase: 89 U/L (ref 38–126)
Anion gap: 10 (ref 5–15)
BUN: 12 mg/dL (ref 6–20)
CO2: 22 mmol/L (ref 22–32)
Calcium: 9.1 mg/dL (ref 8.9–10.3)
Chloride: 105 mmol/L (ref 98–111)
Creatinine, Ser: 0.9 mg/dL (ref 0.61–1.24)
GFR, Estimated: >60 mL/min (ref >60)
Glucose, Bld: 89 mg/dL (ref 70–99)
Potassium: 3.3 mmol/L — ABNORMAL LOW (ref 3.5–5.1)
Sodium: 137 mmol/L (ref 135–145)
Total Bilirubin: 0.9 mg/dL (ref 0.0–1.2)
Total Protein: 7.3 g/dL (ref 6.5–8.1)

## 2023-07-28 LAB — CBC WITH DIFFERENTIAL/PLATELET
Abs Immature Granulocytes: 0.02 10*3/uL (ref 0.00–0.07)
Basophils Absolute: 0.1 10*3/uL (ref 0.0–0.1)
Basophils Relative: 1 %
Eosinophils Absolute: 0.7 10*3/uL — ABNORMAL HIGH (ref 0.0–0.5)
Eosinophils Relative: 8 %
HCT: 44.4 % (ref 39.0–52.0)
Hemoglobin: 14.7 g/dL (ref 13.0–17.0)
Immature Granulocytes: 0 %
Lymphocytes Relative: 38 %
Lymphs Abs: 3.2 10*3/uL (ref 0.7–4.0)
MCH: 29.6 pg (ref 26.0–34.0)
MCHC: 33.1 g/dL (ref 30.0–36.0)
MCV: 89.3 fL (ref 80.0–100.0)
Monocytes Absolute: 0.7 10*3/uL (ref 0.1–1.0)
Monocytes Relative: 8 %
Neutro Abs: 3.8 10*3/uL (ref 1.7–7.7)
Neutrophils Relative %: 45 %
Platelets: 269 10*3/uL (ref 150–400)
RBC: 4.97 MIL/uL (ref 4.22–5.81)
RDW: 13.4 % (ref 11.5–15.5)
WBC: 8.4 10*3/uL (ref 4.0–10.5)
nRBC: 0 % (ref 0.0–0.2)

## 2023-07-28 LAB — LIPASE, BLOOD: Lipase: 23 U/L (ref 11–51)

## 2023-07-28 MED ORDER — DROPERIDOL 2.5 MG/ML IJ SOLN
1.2500 mg | Freq: Once | INTRAMUSCULAR | Status: AC
  Administered 2023-07-28: 1.25 mg via INTRAVENOUS
  Filled 2023-07-28: qty 2

## 2023-07-28 MED ORDER — ONDANSETRON 8 MG PO TBDP: ORAL_TABLET | ORAL | 0 refills | Status: AC

## 2023-07-28 MED ORDER — SODIUM CHLORIDE 0.9 % IV BOLUS
500.0000 mL | Freq: Once | INTRAVENOUS | Status: AC
  Administered 2023-07-28: 500 mL via INTRAVENOUS

## 2023-07-28 NOTE — ED Triage Notes (Signed)
 Pt BIBA from home, c/o abdominal pain. Pt stated he had been vomiting on Tuesday but has not vomited since. VSS.

## 2023-07-28 NOTE — ED Provider Notes (Signed)
 [place] EMERGENCY DEPARTMENT AT [facility] Provider Note   CSN: [id number] Arrival date & time: 07/27/23  2357     History  Chief Complaint  Patient presents with   Abdominal Pain    Parker Berg is a 58 y.o. male.  The history is provided by the patient.  Abdominal Pain Pain location:  Generalized Pain quality: cramping   Pain radiates to:  Does not radiate Pain severity:  Moderate Onset quality:  Gradual Progression:  Waxing and waning Chronicity:  Recurrent Relieved by:  Nothing Worsened by:  Nothing Ineffective treatments:  None tried Associated symptoms: nausea and vomiting   Associated symptoms: no dysuria and no fever   Risk factors: has not had multiple surgeries   Patient with substance abuse including THC presents with recurrent emesis and abdominal pain.  No f/c/r. No diarrhea.     Past Medical History:  Diagnosis Date   Anxiety    Depression    GERD (gastroesophageal reflux disease)    Substance abuse (HCC)      Home Medications Prior to Admission medications   Medication Sig Start Date End Date Taking? Authorizing Provider  doxycycline (VIBRAMYCIN) 100 MG capsule Take 1 capsule (100 mg total) by mouth 2 (two) times daily. One po bid x 7 days 12/06/18   Fayrene Helper, PA-C  QUEtiapine (SEROQUEL) 25 MG tablet Take 1 tablet (25 mg total) by mouth 3 (three) times daily. Patient not taking: Reported on 08/05/2017 05/31/13   Maryagnes Amos, FNP  QUEtiapine (SEROQUEL) 50 MG tablet Take 1 tablet (50 mg total) by mouth at bedtime. Patient not taking: Reported on 08/05/2017 05/31/13   Maryagnes Amos, FNP  sertraline (ZOLOFT) 25 MG tablet Take 1 tablet (25 mg total) by mouth daily. Patient not taking: Reported on 08/05/2017 05/31/13   Maryagnes Amos, FNP  traZODone (DESYREL) 100 MG tablet Take 1 tablet (100 mg total) by mouth at bedtime as needed and may repeat dose one time if needed for sleep. Patient not taking: Reported on  08/05/2017 05/31/13   Maryagnes Amos, FNP      Allergies    Patient has no known allergies.    Review of Systems   Review of Systems  Constitutional:  Negative for fever.  HENT:  Negative for facial swelling.   Respiratory:  Negative for wheezing and stridor.   Cardiovascular:  Negative for leg swelling.  Gastrointestinal:  Positive for abdominal pain, nausea and vomiting.  Genitourinary:  Negative for dysuria.  All other systems reviewed and are negative.   Physical Exam Updated Vital Signs BP (!) 142/92 (BP Location: Left Arm)   Pulse 80   Temp 98.5 F (36.9 C) (Oral)   Resp 16   Ht 5\' 6"  (1.676 m)   Wt 65.8 kg   SpO2 97%   BMI 23.41 kg/m  Physical Exam Vitals and nursing note reviewed.  Constitutional:      General: He is not in acute distress.    Appearance: Normal appearance. He is well-developed. He is not diaphoretic.  HENT:     Head: Normocephalic and atraumatic.     Nose: Nose normal.  Eyes:     Conjunctiva/sclera: Conjunctivae normal.     Pupils: Pupils are equal, round, and reactive to light.  Cardiovascular:     Rate and Rhythm: Normal rate and regular rhythm.     Pulses: Normal pulses.  Pulmonary:     Effort: Pulmonary effort is normal.     Breath sounds:  Normal breath sounds. No wheezing or rales.  Abdominal:     General: Bowel sounds are normal.     Palpations: Abdomen is soft.     Tenderness: There is no abdominal tenderness. There is no guarding or rebound.  Musculoskeletal:        General: Normal range of motion.     Cervical back: Normal range of motion and neck supple.  Skin:    General: Skin is warm and dry.     Capillary Refill: Capillary refill takes less than 2 seconds.  Neurological:     General: No focal deficit present.     Mental Status: He is alert and oriented to person, place, and time.     Deep Tendon Reflexes: Reflexes normal.  Psychiatric:        Mood and Affect: Mood normal.    ED Results / Procedures / Treatments    Labs (all labs ordered are listed, but only abnormal results are displayed) Results for orders placed or performed during the hospital encounter of 07/28/23  CBC with Differential   Collection Time: 07/28/23 12:46 AM  Result Value Ref Range   WBC 8.4 4.0 - 10.5 K/uL   RBC 4.97 4.22 - 5.81 MIL/uL   Hemoglobin 14.7 13.0 - 17.0 g/dL   HCT 16.1 09.6 - 04.5 %   MCV 89.3 80.0 - 100.0 fL   MCH 29.6 26.0 - 34.0 pg   MCHC 33.1 30.0 - 36.0 g/dL   RDW 40.9 81.1 - 91.4 %   Platelets 269 150 - 400 K/uL   nRBC 0.0 0.0 - 0.2 %   Neutrophils Relative % 45 %   Neutro Abs 3.8 1.7 - 7.7 K/uL   Lymphocytes Relative 38 %   Lymphs Abs 3.2 0.7 - 4.0 K/uL   Monocytes Relative 8 %   Monocytes Absolute 0.7 0.1 - 1.0 K/uL   Eosinophils Relative 8 %   Eosinophils Absolute 0.7 (H) 0.0 - 0.5 K/uL   Basophils Relative 1 %   Basophils Absolute 0.1 0.0 - 0.1 K/uL   Immature Granulocytes 0 %   Abs Immature Granulocytes 0.02 0.00 - 0.07 K/uL  Comprehensive metabolic panel   Collection Time: 07/28/23 12:46 AM  Result Value Ref Range   Sodium 137 135 - 145 mmol/L   Potassium 3.3 (L) 3.5 - 5.1 mmol/L   Chloride 105 98 - 111 mmol/L   CO2 22 22 - 32 mmol/L   Glucose, Bld 89 70 - 99 mg/dL   BUN 12 6 - 20 mg/dL   Creatinine, Ser 7.82 0.61 - 1.24 mg/dL   Calcium 9.1 8.9 - 95.6 mg/dL   Total Protein 7.3 6.5 - 8.1 g/dL   Albumin 3.8 3.5 - 5.0 g/dL   AST 22 15 - 41 U/L   ALT 11 0 - 44 U/L   Alkaline Phosphatase 89 38 - 126 U/L   Total Bilirubin 0.9 0.0 - 1.2 mg/dL   GFR, Estimated >21 >30 mL/min   Anion gap 10 5 - 15  Lipase, blood   Collection Time: 07/28/23 12:46 AM  Result Value Ref Range   Lipase 23 11 - 51 U/L   No results found.  EKG EKG Interpretation Date/Time:  Sunday July 28 2023 00:37:45 EDT Ventricular Rate:  74 PR Interval:  106 QRS Duration:  94 QT Interval:  412 QTC Calculation: 458 R Axis:   81  Text Interpretation: Sinus rhythm Confirmed by Nicanor Alcon, Tlaloc Taddei ([id number]) on 07/28/2023  1:33:50 AM  Radiology No results  found.  Procedures Procedures    Medications Ordered in ED Medications  droperidol (INAPSINE) 2.5 MG/ML injection 1.25 mg (1.25 mg Intravenous Given 07/28/23 0130)  sodium chloride 0.9 % bolus 500 mL (0 mLs Intravenous Stopped 07/28/23 0257)    ED Course/ Medical Decision Making/ A&P                                 Medical Decision Making Patient with abdominal pain and nausea and vomiting   Amount and/or Complexity of Data Reviewed External Data Reviewed: notes.    Details: Previous notes reviewed  Labs: ordered.    Details: Normal white count 8.4, normal hemoglobin 14.7, normal platelets. Normal sodium 137, potassium 3.3, normal creatinine.  Lipase normal 23.   ECG/medicine tests: ordered and independent interpretation performed.  Risk Prescription drug management. Risk Details: Patient is feeling better post medication. Normal exam and vitals.  I do not believe this is surgical cause of nausea and pai.  PO challenged, stable for discharge with close follow up.      Final Clinical Impression(s) / ED Diagnoses Final diagnoses:  None    No signs of systemic illness or infection. The patient is nontoxic-appearing on exam and vital signs are within normal limits.  I have reviewed the triage vital signs and the nursing notes. Pertinent labs & imaging results that were available during my care of the patient were reviewed by me and considered in my medical decision making (see chart for details). After history, exam, and medical workup I feel the patient has been appropriately medically screened and is safe for discharge home. Pertinent diagnoses were discussed with the patient. Patient was given return precautions.    Rx / DC Orders ED Discharge Orders     None         Jaelan Rasheed, MD 07/28/23 [phone]

## 2023-11-13 ENCOUNTER — Encounter (HOSPITAL_COMMUNITY): Payer: Self-pay

## 2023-11-13 ENCOUNTER — Emergency Department (HOSPITAL_COMMUNITY)
Admission: EM | Admit: 2023-11-13 | Discharge: 2023-11-14 | Disposition: A | Discharge status: Home / Self Care | Attending: Emergency Medicine | Admitting: Emergency Medicine

## 2023-11-13 ENCOUNTER — Emergency Department (HOSPITAL_COMMUNITY)

## 2023-11-13 ENCOUNTER — Other Ambulatory Visit: Payer: Self-pay

## 2023-11-13 ENCOUNTER — Emergency Department (HOSPITAL_COMMUNITY)
Admission: EM | Admit: 2023-11-13 | Discharge: 2023-11-13 | Discharge status: Against Medical Advice | Attending: Emergency Medicine | Admitting: Emergency Medicine

## 2023-11-13 DIAGNOSIS — R109 Unspecified abdominal pain: Secondary | ICD-10-CM | POA: Insufficient documentation

## 2023-11-13 DIAGNOSIS — R0602 Shortness of breath: Secondary | ICD-10-CM | POA: Insufficient documentation

## 2023-11-13 DIAGNOSIS — R112 Nausea with vomiting, unspecified: Secondary | ICD-10-CM

## 2023-11-13 DIAGNOSIS — Z5321 Procedure and treatment not carried out due to patient leaving prior to being seen by health care provider: Secondary | ICD-10-CM | POA: Diagnosis not present

## 2023-11-13 DIAGNOSIS — R1110 Vomiting, unspecified: Secondary | ICD-10-CM | POA: Diagnosis not present

## 2023-11-13 DIAGNOSIS — F1290 Cannabis use, unspecified, uncomplicated: Secondary | ICD-10-CM

## 2023-11-13 DIAGNOSIS — F12188 Cannabis abuse with other cannabis-induced disorder: Secondary | ICD-10-CM | POA: Diagnosis not present

## 2023-11-13 DIAGNOSIS — R197 Diarrhea, unspecified: Secondary | ICD-10-CM | POA: Insufficient documentation

## 2023-11-13 DIAGNOSIS — R079 Chest pain, unspecified: Secondary | ICD-10-CM | POA: Diagnosis present

## 2023-11-13 LAB — URINALYSIS, ROUTINE W REFLEX MICROSCOPIC
Bacteria, UA: NONE SEEN
Bilirubin Urine: NEGATIVE
Glucose, UA: NEGATIVE mg/dL
Glucose, UA: NEGATIVE mg/dL
Hgb urine dipstick: NEGATIVE
Hgb urine dipstick: NEGATIVE
Ketones, ur: 20 mg/dL — AB
Ketones, ur: 20 mg/dL — AB
Leukocytes,Ua: NEGATIVE
Leukocytes,Ua: NEGATIVE
Nitrite: NEGATIVE
Nitrite: NEGATIVE
Protein, ur: 100 mg/dL — AB
Protein, ur: 30 mg/dL — AB
Specific Gravity, Urine: 1.032 — ABNORMAL HIGH (ref 1.005–1.030)
Specific Gravity, Urine: >1.046 — ABNORMAL HIGH (ref 1.005–1.030)
pH: 8 (ref 5.0–8.0)
pH: 7 (ref 5.0–8.0)

## 2023-11-13 LAB — CBC WITH DIFFERENTIAL/PLATELET
Abs Immature Granulocytes: 0.02 K/uL (ref 0.00–0.07)
Basophils Absolute: 0.1 K/uL (ref 0.0–0.1)
Basophils Relative: 1 %
Eosinophils Absolute: 0.1 K/uL (ref 0.0–0.5)
Eosinophils Relative: 1 %
HCT: 50.7 % (ref 39.0–52.0)
Hemoglobin: 17 g/dL (ref 13.0–17.0)
Immature Granulocytes: 0 %
Lymphocytes Relative: 24 %
Lymphs Abs: 2.8 K/uL (ref 0.7–4.0)
MCH: 29.9 pg (ref 26.0–34.0)
MCHC: 33.5 g/dL (ref 30.0–36.0)
MCV: 89.3 fL (ref 80.0–100.0)
Monocytes Absolute: 0.9 K/uL (ref 0.1–1.0)
Monocytes Relative: 8 %
Neutro Abs: 7.5 K/uL (ref 1.7–7.7)
Neutrophils Relative %: 66 %
Platelets: 429 K/uL — ABNORMAL HIGH (ref 150–400)
RBC: 5.68 MIL/uL (ref 4.22–5.81)
RDW: 13.3 % (ref 11.5–15.5)
WBC: 11.4 K/uL — ABNORMAL HIGH (ref 4.0–10.5)
nRBC: 0 % (ref 0.0–0.2)

## 2023-11-13 LAB — CBC
HCT: 49.3 % (ref 39.0–52.0)
Hemoglobin: 17 g/dL (ref 13.0–17.0)
MCH: 29.9 pg (ref 26.0–34.0)
MCHC: 34.5 g/dL (ref 30.0–36.0)
MCV: 86.6 fL (ref 80.0–100.0)
Platelets: 432 K/uL — ABNORMAL HIGH (ref 150–400)
RBC: 5.69 MIL/uL (ref 4.22–5.81)
RDW: 13.4 % (ref 11.5–15.5)
WBC: 9.8 K/uL (ref 4.0–10.5)
nRBC: 0 % (ref 0.0–0.2)

## 2023-11-13 LAB — RAPID URINE DRUG SCREEN, HOSP PERFORMED
Amphetamines: POSITIVE — AB
Barbiturates: NOT DETECTED
Benzodiazepines: NOT DETECTED
Cocaine: POSITIVE — AB
Opiates: NOT DETECTED
Tetrahydrocannabinol: POSITIVE — AB

## 2023-11-13 LAB — COMPREHENSIVE METABOLIC PANEL WITH GFR
ALT: 11 U/L (ref 0–44)
ALT: 10 U/L (ref 0–44)
AST: 19 U/L (ref 15–41)
AST: 20 U/L (ref 15–41)
Albumin: 4.7 g/dL (ref 3.5–5.0)
Albumin: 3.8 g/dL (ref 3.5–5.0)
Alkaline Phosphatase: 95 U/L (ref 38–126)
Alkaline Phosphatase: 63 U/L (ref 38–126)
Anion gap: 17 — ABNORMAL HIGH (ref 5–15)
Anion gap: 15 (ref 5–15)
BUN: 16 mg/dL (ref 6–20)
BUN: 17 mg/dL (ref 6–20)
CO2: 22 mmol/L (ref 22–32)
CO2: 22 mmol/L (ref 22–32)
Calcium: 10 mg/dL (ref 8.9–10.3)
Calcium: 8.8 mg/dL — ABNORMAL LOW (ref 8.9–10.3)
Chloride: 96 mmol/L — ABNORMAL LOW (ref 98–111)
Chloride: 98 mmol/L (ref 98–111)
Creatinine, Ser: 0.79 mg/dL (ref 0.61–1.24)
Creatinine, Ser: 1.41 mg/dL — ABNORMAL HIGH (ref 0.61–1.24)
GFR, Estimated: >60 mL/min (ref >60)
GFR, Estimated: 58 mL/min — ABNORMAL LOW (ref >60)
Glucose, Bld: 139 mg/dL — ABNORMAL HIGH (ref 70–99)
Glucose, Bld: 113 mg/dL — ABNORMAL HIGH (ref 70–99)
Potassium: 3 mmol/L — ABNORMAL LOW (ref 3.5–5.1)
Potassium: 3.4 mmol/L — ABNORMAL LOW (ref 3.5–5.1)
Sodium: 135 mmol/L (ref 135–145)
Sodium: 135 mmol/L (ref 135–145)
Total Bilirubin: 1.9 mg/dL — ABNORMAL HIGH (ref 0.0–1.2)
Total Bilirubin: 1.6 mg/dL — ABNORMAL HIGH (ref 0.0–1.2)
Total Protein: 9 g/dL — ABNORMAL HIGH (ref 6.5–8.1)
Total Protein: 7.1 g/dL (ref 6.5–8.1)

## 2023-11-13 LAB — LIPASE, BLOOD
Lipase: 25 U/L (ref 11–51)
Lipase: 23 U/L (ref 11–51)

## 2023-11-13 LAB — ETHANOL: Alcohol, Ethyl (B): <15 mg/dL (ref <15)

## 2023-11-13 LAB — TROPONIN I (HIGH SENSITIVITY)
Troponin I (High Sensitivity): 5 ng/L (ref <18)
Troponin I (High Sensitivity): 6 ng/L (ref <18)

## 2023-11-13 MED ORDER — PROMETHAZINE HCL 25 MG PO TABS: 25.0000 mg | ORAL_TABLET | Freq: Four times a day (QID) | ORAL | 0 refills | Status: AC | PRN

## 2023-11-13 MED ORDER — ONDANSETRON HCL 4 MG/2ML IJ SOLN
4.0000 mg | Freq: Once | INTRAMUSCULAR | Status: AC | PRN
  Administered 2023-11-13: 4 mg via INTRAVENOUS
  Filled 2023-11-13: qty 2

## 2023-11-13 MED ORDER — ONDANSETRON HCL 4 MG/2ML IJ SOLN
4.0000 mg | Freq: Once | INTRAMUSCULAR | Status: AC
  Administered 2023-11-13: 4 mg via INTRAVENOUS
  Filled 2023-11-13: qty 2

## 2023-11-13 MED ORDER — SODIUM CHLORIDE 0.9 % IV BOLUS
1000.0000 mL | Freq: Once | INTRAVENOUS | Status: AC
  Administered 2023-11-13: 1000 mL via INTRAVENOUS

## 2023-11-13 MED ORDER — SODIUM CHLORIDE 0.9 % IV BOLUS
1000.0000 mL | Freq: Once | INTRAVENOUS | Status: AC
Start: 2023-11-13 — End: 2023-11-13
  Administered 2023-11-13: 1000 mL via INTRAVENOUS

## 2023-11-13 MED ORDER — IOHEXOL 350 MG/ML SOLN
75.0000 mL | Freq: Once | INTRAVENOUS | Status: AC | PRN
  Administered 2023-11-13: 75 mL via INTRAVENOUS

## 2023-11-13 MED ORDER — HALOPERIDOL LACTATE 5 MG/ML IJ SOLN
2.0000 mg | Freq: Once | INTRAMUSCULAR | Status: AC
  Administered 2023-11-13: 2 mg via INTRAVENOUS
  Filled 2023-11-13: qty 1

## 2023-11-13 MED ORDER — DIPHENHYDRAMINE HCL 50 MG/ML IJ SOLN
25.0000 mg | Freq: Once | INTRAMUSCULAR | Status: AC
  Administered 2023-11-13: 25 mg via INTRAVENOUS
  Filled 2023-11-13: qty 1

## 2023-11-13 NOTE — ED Provider Notes (Signed)
 [place] EMERGENCY DEPARTMENT AT [facility] Provider Note   CSN: [id number] Arrival date & time: 11/13/23  1631     Patient presents with: Chest Pain   Parker Berg is a 58 y.o. male.   HPI  58 y.o. male  was evaluated in triage.  Pt complains of multiple episodes of vomiting for 4 days.  Also multiple episodes of diarrhea.  Stool has been green liquid.  The patient report unable to eat for the past 4 days.  No fevers.  Patient does endorse marijuana use.  He also reports he has had stomach problems for many years.    Prior to Admission medications   Medication Sig Start Date End Date Taking? Authorizing Provider  promethazine  (PHENERGAN ) 25 MG tablet Take 1 tablet (25 mg total) by mouth every 6 (six) hours as needed for nausea or vomiting. 11/13/23  Yes Armenta Canning, MD  doxycycline  (VIBRAMYCIN ) 100 MG capsule Take 1 capsule (100 mg total) by mouth 2 (two) times daily. One po bid x 7 days 12/06/18   Nivia Colon, PA-C  ondansetron  (ZOFRAN -ODT) 8 MG disintegrating tablet 8mg  ODT q8 hours prn nausea 07/28/23   Palumbo, April, MD  QUEtiapine  (SEROQUEL ) 25 MG tablet Take 1 tablet (25 mg total) by mouth 3 (three) times daily. Patient not taking: Reported on 08/05/2017 05/31/13   Wilkie Majel RAMAN, FNP  QUEtiapine  (SEROQUEL ) 50 MG tablet Take 1 tablet (50 mg total) by mouth at bedtime. Patient not taking: Reported on 08/05/2017 05/31/13   Wilkie Majel RAMAN, FNP  sertraline  (ZOLOFT ) 25 MG tablet Take 1 tablet (25 mg total) by mouth daily. Patient not taking: Reported on 08/05/2017 05/31/13   Wilkie Majel RAMAN, FNP  traZODone  (DESYREL ) 100 MG tablet Take 1 tablet (100 mg total) by mouth at bedtime as needed and may repeat dose one time if needed for sleep. Patient not taking: Reported on 08/05/2017 05/31/13   Wilkie Majel RAMAN, FNP    Allergies: Patient has no known allergies.    Review of Systems  Updated Vital Signs BP 113/72 (BP Location: Left Arm)   Pulse 72    Temp 98.1 F (36.7 C) (Oral)   Resp 16   Ht 5' 6 (1.676 m)   Wt 66 kg   SpO2 100%   BMI 23.48 kg/m   Physical Exam Constitutional:      Comments: Patient is complaining of severe pain.  He is having dry heaves.  No respiratory distress.  Mental status clear.  HENT:     Mouth/Throat:     Pharynx: Oropharynx is clear.     Comments: Poor dentition. Eyes:     Extraocular Movements: Extraocular movements intact.  Cardiovascular:     Rate and Rhythm: Normal rate and regular rhythm.  Pulmonary:     Effort: Pulmonary effort is normal.     Breath sounds: Normal breath sounds.  Abdominal:     Comments: Patient endorses diffuse abdominal pain to palpation.  Musculoskeletal:        General: Normal range of motion.     Right lower leg: No edema.     Left lower leg: No edema.  Skin:    General: Skin is warm and dry.  Neurological:     General: No focal deficit present.     Mental Status: He is oriented to person, place, and time.     Motor: No weakness.     Coordination: Coordination normal.     (all labs ordered are listed, but only  abnormal results are displayed) Labs Reviewed  CBC WITH DIFFERENTIAL/PLATELET - Abnormal; Notable for the following components:      Result Value   WBC 11.4 (*)    Platelets 429 (*)    All other components within normal limits  URINALYSIS, ROUTINE W REFLEX MICROSCOPIC - Abnormal; Notable for the following components:   Color, Urine AMBER (*)    Specific Gravity, Urine >1.046 (*)    Ketones, ur 20 (*)    Protein, ur 30 (*)    Bacteria, UA RARE (*)    All other components within normal limits  RAPID URINE DRUG SCREEN, HOSP PERFORMED - Abnormal; Notable for the following components:   Cocaine POSITIVE (*)    Amphetamines POSITIVE (*)    Tetrahydrocannabinol POSITIVE (*)    All other components within normal limits  COMPREHENSIVE METABOLIC PANEL WITH GFR - Abnormal; Notable for the following components:   Potassium 3.4 (*)    Glucose, Bld 113  (*)    Creatinine, Ser 1.41 (*)    Calcium 8.8 (*)    Total Bilirubin 1.6 (*)    GFR, Estimated 58 (*)    All other components within normal limits  MAGNESIUM  - Abnormal; Notable for the following components:   Magnesium  1.3 (*)    All other components within normal limits  ETHANOL  LIPASE, BLOOD  TROPONIN I (HIGH SENSITIVITY)  TROPONIN I (HIGH SENSITIVITY)    EKG: EKG Interpretation Date/Time:  Wednesday November 13 2023 16:47:16 EDT Ventricular Rate:  93 PR Interval:  109 QRS Duration:  95 QT Interval:  407 QTC Calculation: 507 R Axis:   80  Text Interpretation: Sinus rhythm Short PR interval Left ventricular hypertrophy Abnormal T, consider ischemia, diffuse leads diffuse changes likely rate related Confirmed by Armenta Canning [phone]) on 11/13/2023 9:58:28 PM  Radiology: CT CHEST ABDOMEN PELVIS W CONTRAST Result Date: 11/13/2023 CLINICAL DATA:  Sepsis vomiting EXAM: CT CHEST, ABDOMEN, AND PELVIS WITH CONTRAST TECHNIQUE: Multidetector CT imaging of the chest, abdomen and pelvis was performed following the standard protocol during bolus administration of intravenous contrast. RADIATION DOSE REDUCTION: This exam was performed according to the departmental dose-optimization program which includes automated exposure control, adjustment of the mA and/or kV according to patient size and/or use of iterative reconstruction technique. CONTRAST:  75mL OMNIPAQUE  IOHEXOL  350 MG/ML SOLN COMPARISON:  CT 12/01/2012 FINDINGS: CT CHEST FINDINGS Cardiovascular: None aneurysmal aorta. Mild atherosclerosis. Coronary vascular calcification. Normal cardiac size. No pericardial effusion Mediastinum/Nodes: No enlarged mediastinal, hilar, or axillary lymph nodes. Thyroid gland, trachea, and esophagus demonstrate no significant findings. Lungs/Pleura: Advanced emphysema. No acute airspace disease, pleural effusion or pneumothorax Musculoskeletal: Sternum appears intact. No acute osseous abnormality. CT ABDOMEN PELVIS  FINDINGS Hepatobiliary: No focal liver abnormality is seen. Status post cholecystectomy. No biliary dilatation. Pancreas: Unremarkable. No pancreatic ductal dilatation or surrounding inflammatory changes. Spleen: Normal in size without focal abnormality. Adrenals/Urinary Tract: Adrenal glands are unremarkable. Kidneys are normal, without renal calculi, focal lesion, or hydronephrosis. Bladder is unremarkable. Stomach/Bowel: The stomach is nonenlarged. No dilated small bowel. No acute bowel wall thickening Vascular/Lymphatic: Aortic atherosclerosis. No enlarged abdominal or pelvic lymph nodes. Reproductive: Enlarged prostate with mass effect on the bladder Other: Negative for pelvic effusion or free air Musculoskeletal: No acute or suspicious osseous abnormality IMPRESSION: 1. No CT evidence for acute intrathoracic, intra-abdominal, or intrapelvic abnormality. 2. Advanced emphysema. 3. Enlarged prostate with mass effect on the bladder. 4. Aortic atherosclerosis. Aortic Atherosclerosis (ICD10-I70.0) and Emphysema (ICD10-J43.9). Electronically Signed   By: Luke  Scott M.D.   On: 11/13/2023 18:56     Procedures   Medications Ordered in the ED  ondansetron  (ZOFRAN ) injection 4 mg (4 mg Intravenous Given 11/13/23 1701)  sodium chloride  0.9 % bolus 1,000 mL (0 mLs Intravenous Stopped 11/13/23 1911)  iohexol  (OMNIPAQUE ) 350 MG/ML injection 75 mL (75 mLs Intravenous Contrast Given 11/13/23 1815)  ondansetron  (ZOFRAN ) injection 4 mg (4 mg Intravenous Given 11/13/23 2148)  sodium chloride  0.9 % bolus 1,000 mL (1,000 mLs Intravenous New Bag/Given 11/13/23 2333)  diphenhydrAMINE  (BENADRYL ) injection 25 mg (25 mg Intravenous Given 11/13/23 2333)  haloperidol  lactate (HALDOL ) injection 2 mg (2 mg Intravenous Given 11/13/23 2333)                                    Medical Decision Making Amount and/or Complexity of Data Reviewed Labs: ordered. Radiology: ordered.  Risk Prescription drug management.   Patient  presents as outlined.  He is expressing severe pain.  He is having dry heaves.  He reports 4 days of nonstop vomiting and diarrhea.  Will proceed with diagnostic evaluation.  CT imaging interpreted radiology positive for emphysema and atherosclerotic disease but no acute dissection\obstruction or other emergent condition.  GFR 58 potassium 3.4 white count 11.4 H&H 17 and 58 urinalysis 6-10 RBC 0-5 WBC UDS positive cocaine\amphetamine\THC  Patient treated with Haldol  and Benadryl  with good response to treatment.  On recheck patient is no longer symptomatic.  We discussed follow-up plan and prescription for Phenergan  provided.     Final diagnoses:  Cannabinoid hyperemesis syndrome    ED Discharge Orders          Ordered    promethazine  (PHENERGAN ) 25 MG tablet  Every 6 hours PRN        11/13/23 2357               Armenta Canning, MD 11/19/23 1441

## 2023-11-13 NOTE — ED Triage Notes (Signed)
 Patient presented to ER via [facility] from home. Patient states he has been sick since Sunday, unable to keep anything down. Vomiting and diarrhea. Patient states he feels short of breath and denies fevers.

## 2023-11-13 NOTE — ED Triage Notes (Signed)
 Pt bib ems with reports of not feeling well the last few weeks. Was walking outside today for approx 20 minutes when he developed sharp chest pain. Went in the shade to cool off. 12 lead NSR. VSS with ems. 1 sublingual nitroglycerin and 324mg  aspirin given pta. Pt with no pain on arrival. 18g RAC.

## 2023-11-13 NOTE — Discharge Instructions (Addendum)
 1.  Marijuana use can lead to severe episodes of abdominal pain and vomiting.  Review information about cannabinoids hyperemesis syndrome in your discharge instructions. 2.  You should follow-up with your family doctor for recheck.  You have been prescribed phenergan  to take for nausea as needed.

## 2023-11-13 NOTE — ED Provider Triage Note (Signed)
 Emergency Medicine Provider Triage Evaluation Note  Parker Berg , a 58 y.o. male  was evaluated in triage.  Pt complains of multiple episodes of vomiting for 4 days.  Also multiple episodes of diarrhea.  Stool has been green liquid.  The patient report unable to eat for the past 4 days.  No fevers.  Review of Systems  Positive: Copious diarrhea. Negative: No pain burning urgency urination.  No cough.  Physical Exam  There were no vitals taken for this visit. Gen:   Awake, dry heaves, expressing pain. Resp:  Normal effort clear to auscultation MSK:   Moves extremities without difficulty up and ambulatory.  Able to stand from the wheelchair and moved to chair in the room. Other:  Upper abdomen tender to palpation.  Medical Decision Making  Medically screening exam initiated at 4:50 PM.  Appropriate orders placed.  Jimy Gates was informed that the remainder of the evaluation will be completed by another provider, this initial triage assessment does not replace that evaluation, and the importance of remaining in the ED until their evaluation is complete.     Armenta Canning, MD 11/13/23 [phone]

## 2023-11-14 LAB — MAGNESIUM: Magnesium: 1.3 mg/dL — ABNORMAL LOW (ref 1.7–2.4)

## 2024-05-23 DIAGNOSIS — F22 Delusional disorders: Secondary | ICD-10-CM | POA: Insufficient documentation

## 2024-05-23 DIAGNOSIS — L89819 Pressure ulcer of head, unspecified stage: Secondary | ICD-10-CM | POA: Insufficient documentation

## 2024-05-24 ENCOUNTER — Emergency Department (HOSPITAL_COMMUNITY)
Admission: EM | Admit: 2024-05-24 | Discharge: 2024-05-24 | Disposition: A | Attending: Emergency Medicine | Admitting: Emergency Medicine

## 2024-05-24 ENCOUNTER — Encounter (HOSPITAL_COMMUNITY): Payer: Self-pay

## 2024-05-24 ENCOUNTER — Other Ambulatory Visit: Payer: Self-pay

## 2024-05-24 DIAGNOSIS — F22 Delusional disorders: Secondary | ICD-10-CM

## 2024-05-24 MED ORDER — HYDROXYZINE HCL 25 MG PO TABS
25.0000 mg | ORAL_TABLET | Freq: Once | ORAL | Status: AC
  Administered 2024-05-24: 25 mg via ORAL
  Filled 2024-05-24: qty 1

## 2024-05-24 MED ORDER — HYDROXYZINE HCL 25 MG PO TABS: 25.0000 mg | ORAL_TABLET | Freq: Four times a day (QID) | ORAL | 0 refills | Status: AC

## 2024-05-24 NOTE — ED Triage Notes (Signed)
 Sensation of parasites in body.   Admits to smoking mariguana earlier tonight.

## 2024-05-24 NOTE — ED Provider Notes (Signed)
 " [place] EMERGENCY DEPARTMENT AT [facility] Provider Note   CSN: [id number] Arrival date & time: 05/23/24  2359     Patient presents with: Concern for Parasites   Parker Berg is a 58 y.o. male.   Patient to ED with complaint of seeing bugs in his skin. This has been occurring for the past several days. He states he feels something crawling under his skin and picks with his fingernails and tweezers to remove them. He states this causes them to move to other areas such as in the nose, the ear and on the chin.   The history is provided by the patient. No language interpreter was used.       Prior to Admission medications  Medication Sig Start Date End Date Taking? Authorizing Provider  hydrOXYzine  (ATARAX ) 25 MG tablet Take 1 tablet (25 mg total) by mouth every 6 (six) hours. 05/24/24  Yes Odell Balls, PA-C  doxycycline  (VIBRAMYCIN ) 100 MG capsule Take 1 capsule (100 mg total) by mouth 2 (two) times daily. One po bid x 7 days 12/06/18   Nivia Colon, PA-C  ondansetron  (ZOFRAN -ODT) 8 MG disintegrating tablet 8mg  ODT q8 hours prn nausea 07/28/23   Palumbo, April, MD  promethazine  (PHENERGAN ) 25 MG tablet Take 1 tablet (25 mg total) by mouth every 6 (six) hours as needed for nausea or vomiting. 11/13/23   Armenta Canning, MD  QUEtiapine  (SEROQUEL ) 25 MG tablet Take 1 tablet (25 mg total) by mouth 3 (three) times daily. Patient not taking: Reported on 08/05/2017 05/31/13   Wilkie Majel RAMAN, FNP  QUEtiapine  (SEROQUEL ) 50 MG tablet Take 1 tablet (50 mg total) by mouth at bedtime. Patient not taking: Reported on 08/05/2017 05/31/13   Wilkie Majel RAMAN, FNP  sertraline  (ZOLOFT ) 25 MG tablet Take 1 tablet (25 mg total) by mouth daily. Patient not taking: Reported on 08/05/2017 05/31/13   Wilkie Majel RAMAN, FNP  traZODone  (DESYREL ) 100 MG tablet Take 1 tablet (100 mg total) by mouth at bedtime as needed and may repeat dose one time if needed for sleep. Patient not taking:  Reported on 08/05/2017 05/31/13   Wilkie Majel RAMAN, FNP    Allergies: Patient has no known allergies.    Review of Systems  Updated Vital Signs BP (!) 149/110   Pulse (!) 101   Temp 98.2 F (36.8 C) (Oral)   Resp 20   SpO2 100%   Physical Exam Vitals and nursing note reviewed.  Constitutional:      Appearance: He is well-developed.  Pulmonary:     Effort: Pulmonary effort is normal.  Musculoskeletal:        General: Normal range of motion.     Cervical back: Normal range of motion.  Skin:    General: Skin is warm and dry.     Comments: Shallow ulcerations to bilateral parietal scalp without purulence or fluctuance to suggest abscess. Similar lesions to the anterior chin.   Neurological:     Mental Status: He is alert.     (all labs ordered are listed, but only abnormal results are displayed) Labs Reviewed - No data to display  EKG: None  Radiology: No results found.   Procedures   Medications Ordered in the ED  hydrOXYzine  (ATARAX ) tablet 25 mg (25 mg Oral Given 05/24/24 0405)    Clinical Course as of 05/24/24 0409  Sun May 24, 2024  0356 Patient to ED with the belief that he is infested with bugs leaving sores on his skin.  No parasitic infection on exam. He admits to alcohol use, marijuana use, history on chart review of heroin use. Presentation consistent with Delusional parasitosis. Patient reassured there is no parasitic infection, treated with hydroxyzine  and provided mental health resources.  [SU]    Clinical Course User Index [SU] Odell Balls, PA-C                                 Medical Decision Making Risk Prescription drug management.        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

## 2024-05-24 NOTE — Discharge Instructions (Signed)
 Follow up with the resources provided. Take the medication as prescribed for symptoms of skin irritation.
